# Patient Record
Sex: Female | Born: 1945 | Hispanic: No | Marital: Married | Smoking: Never smoker
Health system: Southern US, Community
[De-identification: ages and names within clinical notes are randomized; demographics above are authoritative.]

## PROBLEM LIST (undated history)

## (undated) DIAGNOSIS — E079 Disorder of thyroid, unspecified: Secondary | ICD-10-CM

## (undated) DIAGNOSIS — E119 Type 2 diabetes mellitus without complications: Secondary | ICD-10-CM

## (undated) DIAGNOSIS — E785 Hyperlipidemia, unspecified: Secondary | ICD-10-CM

---

## 2000-03-05 ENCOUNTER — Encounter: Admission: RE | Admit: 2000-03-05 | Discharge: 2000-06-03 | Payer: Self-pay | Admitting: *Deleted

## 2000-04-19 ENCOUNTER — Encounter: Admission: RE | Admit: 2000-04-19 | Discharge: 2000-04-19 | Payer: Self-pay | Admitting: Internal Medicine

## 2000-04-19 ENCOUNTER — Encounter: Payer: Self-pay | Admitting: Internal Medicine

## 2001-03-20 ENCOUNTER — Other Ambulatory Visit: Admission: RE | Admit: 2001-03-20 | Discharge: 2001-03-20 | Payer: Self-pay | Admitting: Obstetrics & Gynecology

## 2001-03-21 ENCOUNTER — Other Ambulatory Visit: Admission: RE | Admit: 2001-03-21 | Discharge: 2001-03-21 | Payer: Self-pay | Admitting: Obstetrics & Gynecology

## 2001-03-21 ENCOUNTER — Encounter (INDEPENDENT_AMBULATORY_CARE_PROVIDER_SITE_OTHER): Payer: Self-pay

## 2001-04-26 ENCOUNTER — Encounter: Payer: Self-pay | Admitting: Internal Medicine

## 2001-04-26 ENCOUNTER — Encounter: Admission: RE | Admit: 2001-04-26 | Discharge: 2001-04-26 | Payer: Self-pay | Admitting: Internal Medicine

## 2002-06-12 ENCOUNTER — Encounter: Admission: RE | Admit: 2002-06-12 | Discharge: 2002-06-12 | Payer: Self-pay | Admitting: Internal Medicine

## 2002-06-12 ENCOUNTER — Encounter: Payer: Self-pay | Admitting: Internal Medicine

## 2002-09-22 ENCOUNTER — Other Ambulatory Visit: Admission: RE | Admit: 2002-09-22 | Discharge: 2002-09-22 | Payer: Self-pay | Admitting: Obstetrics & Gynecology

## 2003-07-03 ENCOUNTER — Encounter: Admission: RE | Admit: 2003-07-03 | Discharge: 2003-07-03 | Payer: Self-pay | Admitting: Internal Medicine

## 2003-07-03 ENCOUNTER — Encounter: Payer: Self-pay | Admitting: Internal Medicine

## 2003-10-01 ENCOUNTER — Other Ambulatory Visit: Admission: RE | Admit: 2003-10-01 | Discharge: 2003-10-01 | Payer: Self-pay | Admitting: Obstetrics & Gynecology

## 2003-10-09 ENCOUNTER — Encounter: Admission: RE | Admit: 2003-10-09 | Discharge: 2003-10-09 | Payer: Self-pay | Admitting: Obstetrics & Gynecology

## 2004-10-03 ENCOUNTER — Inpatient Hospital Stay (HOSPITAL_COMMUNITY): Admission: EM | Admit: 2004-10-03 | Discharge: 2004-10-05 | Payer: Self-pay | Admitting: Emergency Medicine

## 2004-11-07 ENCOUNTER — Encounter: Admission: RE | Admit: 2004-11-07 | Discharge: 2004-11-07 | Payer: Self-pay | Admitting: Internal Medicine

## 2005-11-08 ENCOUNTER — Encounter: Admission: RE | Admit: 2005-11-08 | Discharge: 2005-11-08 | Payer: Self-pay | Admitting: Internal Medicine

## 2006-01-22 ENCOUNTER — Ambulatory Visit (HOSPITAL_COMMUNITY): Admission: RE | Admit: 2006-01-22 | Discharge: 2006-01-22 | Payer: Self-pay | Admitting: Gastroenterology

## 2006-01-22 ENCOUNTER — Encounter (INDEPENDENT_AMBULATORY_CARE_PROVIDER_SITE_OTHER): Payer: Self-pay | Admitting: Specialist

## 2006-11-29 ENCOUNTER — Encounter: Admission: RE | Admit: 2006-11-29 | Discharge: 2006-11-29 | Payer: Self-pay | Admitting: Internal Medicine

## 2007-12-02 ENCOUNTER — Encounter: Admission: RE | Admit: 2007-12-02 | Discharge: 2007-12-02 | Payer: Self-pay | Admitting: Internal Medicine

## 2008-12-28 ENCOUNTER — Encounter: Admission: RE | Admit: 2008-12-28 | Discharge: 2008-12-28 | Payer: Self-pay | Admitting: Internal Medicine

## 2009-04-21 ENCOUNTER — Ambulatory Visit: Payer: Self-pay | Admitting: Family Medicine

## 2009-04-21 DIAGNOSIS — E039 Hypothyroidism, unspecified: Secondary | ICD-10-CM | POA: Insufficient documentation

## 2009-04-21 DIAGNOSIS — E1065 Type 1 diabetes mellitus with hyperglycemia: Secondary | ICD-10-CM | POA: Insufficient documentation

## 2009-04-21 DIAGNOSIS — E785 Hyperlipidemia, unspecified: Secondary | ICD-10-CM | POA: Insufficient documentation

## 2009-04-21 DIAGNOSIS — IMO0002 Reserved for concepts with insufficient information to code with codable children: Secondary | ICD-10-CM | POA: Insufficient documentation

## 2010-01-05 ENCOUNTER — Encounter: Admission: RE | Admit: 2010-01-05 | Discharge: 2010-01-05 | Payer: Self-pay | Admitting: Internal Medicine

## 2010-12-07 ENCOUNTER — Other Ambulatory Visit: Payer: Self-pay | Admitting: Internal Medicine

## 2010-12-07 DIAGNOSIS — Z1231 Encounter for screening mammogram for malignant neoplasm of breast: Secondary | ICD-10-CM

## 2011-01-16 ENCOUNTER — Ambulatory Visit
Admission: RE | Admit: 2011-01-16 | Discharge: 2011-01-16 | Disposition: A | Discharge status: Home / Self Care | Payer: BC Managed Care – PPO | Source: Ambulatory Visit | Attending: Internal Medicine | Admitting: Internal Medicine

## 2011-01-16 DIAGNOSIS — Z1231 Encounter for screening mammogram for malignant neoplasm of breast: Secondary | ICD-10-CM

## 2011-03-17 NOTE — H&P (Signed)
NAMEJEREMIE, Nichole [name]               ACCOUNT NO.:  [id number]   MEDICAL RECORD NO.:  [id number]          PATIENT TYPE:  INP   LOCATION:  2103                         FACILITY:  [facility]   PHYSICIAN:  Reather Littler, M.D.       DATE OF BIRTH:  Mar 05, 1946   DATE OF ADMISSION:  10/03/2004  DATE OF DISCHARGE:                                HISTORY & PHYSICAL   CHIEF COMPLAINT:  Vomiting.   HISTORY:  This is a patient who has had type 1 diabetes since 1997 and has  been treated with insulin all along.  She was started on an insulin pump in  March of 2002 because of variable blood sugars.  Overall, the patient has  had generally fairly good control and her last A1c was 6.5%.   The day prior to admission, the patient started having blood sugars over 400  and also nausea and vomiting.  She says that the high sugars were probably  from a bubble in the tubing of her infusion set, but despite changing, her  blood sugars did not come down and she continued to have vomiting.  On the  afternoon of the admission, she says her blood sugars started coming down to  180, but she did not have any improvement in her nausea and vomiting.  She  has not been able to take any liquids and keep them down, and continues to  have persistent vomiting and weakness.   The patient states that her insulin infusion site again had bubbles on the  day of admission, but blood sugars did not improve even with changing the  tubing.  She did not check her ketones and did not take any insulin  subcutaneously by injection.  She was then brought to the emergency room  because of persistent vomiting and high sugars.   The patient's basal rate with her [employer]etronic pump is 0.4 from midnight to 4  a.m. and also from 8 a.m. to noon.  From 4 a.m. to 8 a.m., her basal rate is  0.7, from noon to 6 p.m., it is 0.9 and after 6 p.m., it is 1.0.  She  boluses 1 unit per 10 g of carbohydrate.  She rechecks her blood sugar 3  times a day.   MEDICATIONS:  Insulin, Lipitor, Synthroid and aspirin.   ALLERGIES:  None.   PAST HISTORY:  Her only significant illness was Giardia infection.   PERSONAL HISTORY:  She is a nonsmoker.  She is a vegetarian.   FAMILY HISTORY:  No history of diabetes, hypertension or heart disease.   REVIEW OF SYSTEMS:  She has had mild hypothyroidism.  She has had  hypercholesterolemia which is treated.  She is postmenopausal.  She has had  minimal diabetic complications, but no symptoms of neuropathy and also no  retinopathy.  She has previously had no recent GI or genitourinary symptoms.  She has no history of diarrhea or fever/chills.   PHYSICAL EXAMINATION:  GENERAL:  The patient is alert and cooperative.  VITAL SIGNS:  Her blood pressure is 100/60, pulse is 90, temperature normal.  HEENT:  Her mucous membranes look very dry, otherwise, ENT exam normal.  Eyes normal.  NECK:  Exam normal.  HEART:  Heart sounds normal.  LUNGS:  Clear.  ABDOMEN:  No tenderness.  The infusion site is normal.  EXTREMITIES:  Normal.   LABORATORIES:  Glucose is 455, bicarbonate 16.7, potassium 5.3.   ASSESSMENT:  The patient appears to have diabetic ketoacidosis with  persistent vomiting.   PLAN:  Plan would be to start IV insulin with Glucomander and continue her  insulin pump.  We will give her saline to hydrate her and follow blood sugar  hourly.  Chemistry will be rechecked in the morning.       AK/MEDQ  D:  10/04/2004  T:  10/04/2004  Job:  [id number]

## 2011-03-17 NOTE — Op Note (Signed)
Nichole [name], Nichole [name]               ACCOUNT NO.:  [id number]   MEDICAL RECORD NO.:  [id number]          PATIENT TYPE:  AMB   LOCATION:  ENDO                         FACILITY:  [facility]   PHYSICIAN:  Anselmo Rod, M.D.  DATE OF BIRTH:  05-Jan-1946   DATE OF PROCEDURE:  01/22/2006  DATE OF DISCHARGE:                                 OPERATIVE REPORT   PROCEDURE PERFORMED:  Colonoscopy with cold biopsies x 2.   ENDOSCOPIST:  Anselmo Rod, M.D.   INSTRUMENT USED:  Olympus video colonoscope.   INDICATIONS FOR PROCEDURE:  A 66 year old [place] female undergoing screening  colonoscopy to rule out colonic polyps, masses, etc.   PREPROCEDURE PREPARATION:  Informed consent was procured from the patient.  The patient was fasted for eight hours prior to the procedure and prepped  with a bottle of magnesium citrate and a gallon of GoLytely the night prior  to the procedure.  The risks and benefits of the procedure including a 10%  miss rate for cancer or polyps was discussed with the patient as well.   PREPROCEDURE PHYSICAL:  The patient had stable vital signs.  Neck supple.  Chest clear to auscultation.  S1 and S2 regular.  Abdomen soft with normal  bowel sounds.   DESCRIPTION OF PROCEDURE:  The patient was placed in left lateral decubitus  position and sedated with 125 mcg of fentanyl and 12.5 mg of Versed in slow  incremental doses.  Once the patient was adequately sedated and maintained  on low flow oxygen and continuous cardiac monitoring, the Olympus video  colonoscope was advanced from the rectum to the cecum with extreme  difficulty.  The patient had a very tortuous colon.  The patient's position  was changed from left lateral to the supine and right lateral and prone  position and then back to the left lateral position with gentle application  of abdominal pressure on several occasions to reach the cecal base.  A small  sessile polyp was removed by cold biopsies x 2.  The terminal  ileum appeared  healthy without lesions.  The procedure was prolonged as the patient had a  very tortuous colon, multiple washes were done.  The prep was adequate.  Retroflexion in the rectum revealed no abnormalities.   IMPRESSION:  1.  Normal colonoscopy up to the terminal ileum except for small sessile      polyp biopsied from the cecal base.  2.  Very tortuous colon.  Patient's position changed on several occasions.   RECOMMENDATIONS:  1.  Await pathology results.  2.  Avoid all nonsteroidals including aspirin for the next two weeks.  3.  Repeat colonoscopy depending on pathology results.  4.  CBGs will be checked prior to discharge from the endoscopy unit.  5.  Outpatient followup as need arises in the future.      Anselmo Rod, M.D.  Electronically Signed     JNM/MEDQ  D:  01/22/2006  T:  01/23/2006  Job:  [id number]   cc:   Georgann Housekeeper, MD  Fax: [phone]

## 2011-03-17 NOTE — Discharge Summary (Signed)
NAMESINIA, Nichole Nichole [name]               ACCOUNT NO.:  [id number]   MEDICAL RECORD NO.:  [id number]          PATIENT TYPE:  INP   LOCATION:  3711                         FACILITY:  [facility]   PHYSICIAN:  Reather Littler, M.D.       DATE OF BIRTH:  [birth year]   DATE OF ADMISSION:  10/03/2004  DATE OF DISCHARGE:  10/05/2004                                 DISCHARGE SUMMARY   HISTORY:  The patient was admitted with high blood sugars and vomiting and  she was found in the emergency room to be in ketoacidosis.  Her blood sugar  was 455.  The patient had been having high blood sugars for at least Nichole [name] day  and were not improving even with changing her infusion set.  The patient has  had known diabetes since 1997.  The rest of the history as per HPI.   PHYSICAL EXAMINATION:  Blood pressure 100/60, pulse 90, mucous membranes  were very dry.  The rest of her exam was unremarkable.   HOSPITAL COURSE:  The patient was started on intravenous saline and blood  sugar controlled with the help of Glucommander and IV insulin drip.  The  insulin drip was normal by 5:00 Nichole [name].m. and her insulin pump was restarted,  blood pressure had been supported with IV fluids and colloid infusion.  The  patient was observed for another day with increasing diet and blood sugar  was down to 197 on the day of discharge.   DISCHARGE INSTRUCTIONS:  To continue all medications and insulin as before.  To also discuss pump malfunction with the manufacturer of her pump.  The  current basal rate on the pump was 50% higher than prior to her illness and  this will be taken down to the normal basal rate once the patients blood  sugars start decreasing.  Diet as before with no restrictions and follow up  in the office in one week.      AK/MEDQ  D:  02/11/2005  T:  02/11/2005  Job:  [id number]

## 2011-04-10 ENCOUNTER — Other Ambulatory Visit: Payer: Self-pay | Admitting: Obstetrics & Gynecology

## 2011-12-12 ENCOUNTER — Other Ambulatory Visit: Payer: Self-pay | Admitting: Internal Medicine

## 2011-12-12 DIAGNOSIS — Z1231 Encounter for screening mammogram for malignant neoplasm of breast: Secondary | ICD-10-CM

## 2012-02-13 ENCOUNTER — Ambulatory Visit: Payer: BC Managed Care – PPO

## 2012-02-27 ENCOUNTER — Ambulatory Visit
Admission: RE | Admit: 2012-02-27 | Discharge: 2012-02-27 | Disposition: A | Discharge status: Home / Self Care | Payer: Medicare Other | Source: Ambulatory Visit | Attending: Internal Medicine | Admitting: Internal Medicine

## 2012-02-27 DIAGNOSIS — Z1231 Encounter for screening mammogram for malignant neoplasm of breast: Secondary | ICD-10-CM

## 2012-10-18 ENCOUNTER — Emergency Department (INDEPENDENT_AMBULATORY_CARE_PROVIDER_SITE_OTHER)
Admission: EM | Admit: 2012-10-18 | Discharge: 2012-10-18 | Disposition: A | Discharge status: Home / Self Care | Payer: Medicare Other | Source: Home / Self Care | Attending: Family Medicine | Admitting: Family Medicine

## 2012-10-18 DIAGNOSIS — Z008 Encounter for other general examination: Secondary | ICD-10-CM
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
## 2013-01-21 ENCOUNTER — Other Ambulatory Visit: Payer: Self-pay

## 2013-01-21 DIAGNOSIS — Z1231 Encounter for screening mammogram for malignant neoplasm of breast: Secondary | ICD-10-CM

## 2013-02-27 ENCOUNTER — Ambulatory Visit
Admission: RE | Admit: 2013-02-27 | Discharge: 2013-02-27 | Disposition: A | Discharge status: Home / Self Care | Payer: Medicare Other | Source: Ambulatory Visit

## 2013-02-27 DIAGNOSIS — Z1231 Encounter for screening mammogram for malignant neoplasm of breast: Secondary | ICD-10-CM

## 2013-02-28 ENCOUNTER — Other Ambulatory Visit: Payer: Self-pay | Admitting: Internal Medicine

## 2013-02-28 DIAGNOSIS — R928 Other abnormal and inconclusive findings on diagnostic imaging of breast: Secondary | ICD-10-CM

## 2013-04-09 ENCOUNTER — Other Ambulatory Visit: Payer: Self-pay | Admitting: Internal Medicine

## 2013-04-09 ENCOUNTER — Ambulatory Visit
Admission: RE | Admit: 2013-04-09 | Discharge: 2013-04-09 | Disposition: A | Discharge status: Home / Self Care | Payer: Medicare Other | Source: Ambulatory Visit | Attending: Internal Medicine | Admitting: Internal Medicine

## 2013-04-09 DIAGNOSIS — R928 Other abnormal and inconclusive findings on diagnostic imaging of breast: Secondary | ICD-10-CM

## 2013-04-24 ENCOUNTER — Ambulatory Visit
Admission: RE | Admit: 2013-04-24 | Discharge: 2013-04-24 | Disposition: A | Discharge status: Home / Self Care | Payer: Medicare Other | Source: Ambulatory Visit | Attending: Internal Medicine | Admitting: Internal Medicine

## 2013-04-24 DIAGNOSIS — R928 Other abnormal and inconclusive findings on diagnostic imaging of breast: Secondary | ICD-10-CM

## 2013-05-30 ENCOUNTER — Telehealth: Payer: Self-pay | Admitting: Endocrinology

## 2013-05-30 NOTE — Telephone Encounter (Signed)
Pt last seen

## 2013-06-04 ENCOUNTER — Other Ambulatory Visit: Payer: Self-pay | Admitting: *Deleted

## 2013-06-04 MED ORDER — GLUCAGON (RDNA) 1 MG IJ KIT: 1.0000 mg | PACK | Freq: Once | INTRAMUSCULAR | Status: AC | PRN

## 2013-07-07 ENCOUNTER — Other Ambulatory Visit: Payer: Medicare Other

## 2013-07-07 ENCOUNTER — Other Ambulatory Visit: Payer: Self-pay | Admitting: *Deleted

## 2013-07-07 DIAGNOSIS — E039 Hypothyroidism, unspecified: Secondary | ICD-10-CM

## 2013-07-07 DIAGNOSIS — E109 Type 1 diabetes mellitus without complications: Secondary | ICD-10-CM

## 2013-07-07 DIAGNOSIS — E785 Hyperlipidemia, unspecified: Secondary | ICD-10-CM

## 2013-07-08 ENCOUNTER — Other Ambulatory Visit (INDEPENDENT_AMBULATORY_CARE_PROVIDER_SITE_OTHER): Payer: Medicare Other

## 2013-07-08 ENCOUNTER — Ambulatory Visit: Payer: Medicare Other | Admitting: Endocrinology

## 2013-07-08 DIAGNOSIS — E109 Type 1 diabetes mellitus without complications: Secondary | ICD-10-CM

## 2013-07-08 DIAGNOSIS — E785 Hyperlipidemia, unspecified: Secondary | ICD-10-CM

## 2013-07-08 DIAGNOSIS — E039 Hypothyroidism, unspecified: Secondary | ICD-10-CM

## 2013-07-08 LAB — URINALYSIS, ROUTINE W REFLEX MICROSCOPIC
Ketones, ur: NEGATIVE
Nitrite: NEGATIVE
Specific Gravity, Urine: 1.01 (ref 1.000–1.030)
pH: 7 (ref 5.0–8.0)

## 2013-07-08 LAB — LIPID PANEL
Cholesterol: 157 mg/dL (ref 0–200)
LDL Cholesterol: 86 mg/dL (ref 0–99)

## 2013-07-08 LAB — MICROALBUMIN / CREATININE URINE RATIO
Creatinine,U: 80.2 mg/dL
Microalb, Ur: 0.2 mg/dL (ref 0.0–1.9)

## 2013-07-08 LAB — COMPREHENSIVE METABOLIC PANEL
ALT: 16 U/L (ref 0–35)
CO2: 31 mEq/L (ref 19–32)
Creatinine, Ser: 0.7 mg/dL (ref 0.4–1.2)
GFR: 93.34 mL/min (ref >60.00)
Total Bilirubin: 1 mg/dL (ref 0.3–1.2)

## 2013-07-08 LAB — T4, FREE: Free T4: 1.44 ng/dL (ref 0.60–1.60)

## 2013-07-10 ENCOUNTER — Encounter: Payer: Self-pay | Admitting: Endocrinology

## 2013-07-14 ENCOUNTER — Encounter: Payer: Self-pay | Admitting: Endocrinology

## 2013-07-14 ENCOUNTER — Ambulatory Visit (INDEPENDENT_AMBULATORY_CARE_PROVIDER_SITE_OTHER): Payer: Medicare Other | Admitting: Endocrinology

## 2013-07-14 VITALS — BP 128/80 | HR 76 | Temp 98.3°F | Resp 12 | Ht 67.0 in | Wt 164.1 lb

## 2013-07-14 DIAGNOSIS — E039 Hypothyroidism, unspecified: Secondary | ICD-10-CM

## 2013-07-14 DIAGNOSIS — E109 Type 1 diabetes mellitus without complications: Secondary | ICD-10-CM

## 2013-07-14 DIAGNOSIS — E785 Hyperlipidemia, unspecified: Secondary | ICD-10-CM

## 2013-07-14 NOTE — Patient Instructions (Addendum)
Bolus 15-30 min before meals  Add 1 unit to lunch for bolus  If sugar < 120 may use temp basal 50%

## 2013-07-14 NOTE — Progress Notes (Signed)
Patient ID: Nichole [name], female   DOB: 10/19/1946, 67 y.o.   MRN: [id number]  Nichole [name] is an 67 y.o. female.   Reason for Appointment: Insulin Pump followup:   History of Present Illness   Diagnosis: Type 1 DIABETES MELITUS, date of diagnosis: 1997      DIABETES history: She was started on insulin pump in 2002 because of overall poor control and variability in blood sugars  CURRENT insulin pump:  [employer]  HISTORY: Her blood sugars appear to be overall poorly controlled with average blood sugar over 200 Has difficulty controlling postprandial hyperglycemia and has had periodic adjustments to her settings made Currently taking relatively high coverage for carbohydrates Most of the hyperglycemia appears to be right after lunch and persisting into the evening Variable readings after breakfast, occasionally much higher. Random episodes of low blood sugars either late morning and last night at 1 AM Very high carbohydrate coverage required along with her entering only 10-20 g; she thinks she is doing this accurately Mostly high readings right after waking up average blood sugar before breakfast 184 Also she is concerned about her weight gain  The pump SETTINGS are: Basal rate: See attached document  Carb Ratio: 1: 7/4/4 at meals Correction factor 1: 40 with target 140 except from 7-known = 100  GLUCOSE CONTROL with the pump is assessed today by pump download.   Compared to the last visit the diabetes is relatively worse since last A1c was 7.6 in April  Lab Results  Component Value Date   HGBA1C 8.6* 07/08/2013    EXERCISE: Walking 4-5/7 days, frequently in the mornings. Blood sugar does not go down  Until the end of her walk  MICROALBUMIN has been tested, and the result is normal .   CGM RECORD INTERPRETATION    Dates of Recording: 06/30/13 through 07/13/13        Indications: Poor glycemic control. Variable blood sugars and unpredictable hyperglycemia and hypoglycemia.  Identification of postprandial patterns and patterns of overnight glycemia     Sensor  summary:  Quality of the data is excellent with adequate sensor function. Glucose excursion profile shows      Glycemic patterns:   she has significant postprandial hyperglycemia after lunch but also continuing hyperglycemia into the late evening; starting high excursions are about 11:30 AM and again about 6 PM.. Significant amount of variability at all times with standard deviation 70. Overall average 210      Overnight periods:   blood sugar at bedtime as averaging about 160 with tendency to gradual decline and leveling off at about 140s between 4 AM-6 AM. No hypoglycemia on any day with lowest blood sugar about 70      Preprandial periods:   Average glucose before breakfast is 184, before lunch 147 and before supper 229      Postprandial periods:   blood sugar is increasing to about 206 after breakfast but has had marked increase on 2 of the days. Blood sugars are increasing more than 100 mg after lunch with somewhat variable response. After supper blood sugars relatively unchanged an average but showed mild increase about 2 hours later. Significant variability present after supper      Hypoglycemia:  not detected except transiently at 5 PM once      LABS:  Appointment on 07/08/2013  Component Date Value Range Status  . Cholesterol 07/08/2013 157  0 - 200 mg/dL Final   ATP III Classification       Desirable:  <  200 mg/dL               Borderline High:  200 - 239 mg/dL          High:  > = 161 mg/dL  . Triglycerides 07/08/2013 65.0  0.0 - 149.0 mg/dL Final   Normal:  <096 mg/dLBorderline High:  150 - 199 mg/dL  . HDL 07/08/2013 58.00  >39.00 mg/dL Final  . VLDL 04/54/0981 13.0  0.0 - 40.0 mg/dL Final  . LDL Cholesterol 07/08/2013 86  0 - 99 mg/dL Final  . Total CHOL/HDL Ratio 07/08/2013 3   Final                  Men          Women1/2 Average Risk     3.4          3.3Average Risk          5.0           4.42X Average Risk          9.6          7.13X Average Risk          15.0          11.0                      . Hemoglobin A1C 07/08/2013 8.6* 4.6 - 6.5 % Final   Glycemic Control Guidelines for People with Diabetes:Non Diabetic:  <6%Goal of Therapy: <7%Additional Action Suggested:  >8%   . Microalb, Ur 07/08/2013 0.2  0.0 - 1.9 mg/dL Final  . Creatinine,U 19/14/7829 80.2   Final  . Microalb Creat Ratio 07/08/2013 0.2  0.0 - 30.0 mg/g Final  . TSH 07/08/2013 1.90  0.35 - 5.50 uIU/mL Final  . Free T4 07/08/2013 1.44  0.60 - 1.60 ng/dL Final  . Sodium 56/21/3086 142  135 - 145 mEq/L Final  . Potassium 07/08/2013 3.5  3.5 - 5.1 mEq/L Final  . Chloride 07/08/2013 104  96 - 112 mEq/L Final  . CO2 07/08/2013 31  19 - 32 mEq/L Final  . Glucose, Bld 07/08/2013 61* 70 - 99 mg/dL Final  . BUN 57/84/6962 12  6 - 23 mg/dL Final  . Creatinine, Ser 07/08/2013 0.7  0.4 - 1.2 mg/dL Final  . Total Bilirubin 07/08/2013 1.0  0.3 - 1.2 mg/dL Final  . Alkaline Phosphatase 07/08/2013 79  39 - 117 U/L Final  . AST 07/08/2013 24  0 - 37 U/L Final  . ALT 07/08/2013 16  0 - 35 U/L Final  . Total Protein 07/08/2013 6.8  6.0 - 8.3 g/dL Final  . Albumin 95/28/4132 3.5  3.5 - 5.2 g/dL Final  . Calcium 44/10/270 9.5  8.4 - 10.5 mg/dL Final  . GFR 53/66/4403 93.34  >60.00 mL/min Final  . Color, Urine 07/08/2013 LT. YELLOW  Yellow;Lt. Yellow Final  . APPearance 07/08/2013 CLEAR  Clear Final  . Specific Gravity, Urine 07/08/2013 1.010  1.000-1.030 Final  . pH 07/08/2013 7.0  5.0 - 8.0 Final  . Total Protein, Urine 07/08/2013 NEGATIVE  Negative Final  . Urine Glucose 07/08/2013 250  Negative Final  . Ketones, ur 07/08/2013 NEGATIVE  Negative Final  . Bilirubin Urine 07/08/2013 NEGATIVE  Negative Final  . Hgb urine dipstick 07/08/2013 LARGE  Negative Final  . Urobilinogen, UA 07/08/2013 0.2  0.0 - 1.0 Final  . Leukocytes, UA 07/08/2013 SMALL  Negative Final  . Nitrite 07/08/2013 NEGATIVE  Negative Final  . WBC, UA  07/08/2013 11-20/hpf  0-2/hpf Final  . RBC / HPF 07/08/2013 7-10/hpf  0-2/hpf Final  . Bacteria, UA 07/08/2013 Rare(<10/hpf)  None Final      Medication List       This list is accurate as of: 07/14/13  8:25 AM.  Always use your most recent med list.               aspirin 81 MG tablet  Take 81 mg by mouth daily.     atorvastatin 20 MG tablet  Commonly known as:  LIPITOR  Take 20 mg by mouth daily.     BAYER CONTOUR NEXT TEST test strip  Generic drug:  glucose blood     BAYER MICROLET LANCETS lancets     EYE DROPS A/C OP  Apply to eye. One drop in each eye daily     glucagon 1 MG injection  Inject 1 mg into the vein once as needed.     levothyroxine 75 MCG tablet  Commonly known as:  SYNTHROID, LEVOTHROID  Take 75 mcg by mouth daily before breakfast.     timolol 0.5 % ophthalmic solution  Commonly known as:  TIMOPTIC     VITAMIN D PO  Take 500 Units by mouth daily.        Allergies: No Known Allergies  No past medical history on file.  No past surgical history on file.  No family history on file.  Social History:  reports that she has never smoked. She has never used smokeless tobacco. She reports that she does not drink alcohol or use illicit drugs.  REVIEW of systems:  She has had long-standing hypothyroidism, adequately replaced at 75 mcg and her TSH was normal in 6/14 No history of neuropathy She has been on Lipitor for hypercholesterolemia with adequate control   EXAM:  BP 128/80  Pulse 76  Temp(Src) 98.3 F (36.8 C)  Resp 12  Ht 5\' 7"  (1.702 m)  Wt 164 lb 1.6 oz (74.435 kg)  BMI 25.7 kg/m2  SpO2 98%  ASSESSMENT:  Problems identified: Overall excessively high readings with average blood sugar at home 210 Most of the hypoglycemia appears to be right after lunch and persisting into the evening Variable readings after breakfast, occasionally much higher. Random episodes of low blood sugars either late morning and last night at 1 AM Very  high carbohydrate coverage required along with her entering only 10-20 g; she thinks she is doing this accurately Mostly high readings right after waking up average blood sugar before breakfast 184 Also she is concerned about her weight gain  Plan:  Increase basal rates between noon - 2 PM = 0.6, 2 PM = 0.7 and 5 PM = 0. 45 until 7 PM Continue the bolus 30 minutes before a meal unless blood sugar is low normal Consider using the temporary basal when going for long walks especially towards the end Make sure mealtime carbohydrate is calculated accurately Additional one unit to the meal bolus calculation at lunchtime  Consider Victoza on the next visit for GLP-1 benefit as she tends to have tendency to postprandial hyperglycemia  Counseling time over 50% of today's 25 minute visit  Walker Sitar 07/14/2013, 8:25 AM

## 2013-09-08 ENCOUNTER — Other Ambulatory Visit: Payer: Medicare Other

## 2013-09-15 ENCOUNTER — Ambulatory Visit: Payer: Medicare Other | Admitting: Endocrinology

## 2013-10-03 ENCOUNTER — Other Ambulatory Visit (INDEPENDENT_AMBULATORY_CARE_PROVIDER_SITE_OTHER): Payer: Medicare Other

## 2013-10-03 ENCOUNTER — Ambulatory Visit: Payer: Medicare Other | Admitting: Endocrinology

## 2013-10-03 DIAGNOSIS — E109 Type 1 diabetes mellitus without complications: Secondary | ICD-10-CM

## 2013-10-03 DIAGNOSIS — E785 Hyperlipidemia, unspecified: Secondary | ICD-10-CM

## 2013-10-03 DIAGNOSIS — E039 Hypothyroidism, unspecified: Secondary | ICD-10-CM

## 2013-10-03 LAB — BASIC METABOLIC PANEL
BUN: 15 mg/dL (ref 6–23)
Calcium: 9.4 mg/dL (ref 8.4–10.5)
Creatinine, Ser: 0.7 mg/dL (ref 0.4–1.2)
GFR: 83.17 mL/min (ref >60.00)

## 2013-10-03 LAB — LIPID PANEL
HDL: 62.3 mg/dL (ref >39.00)
LDL Cholesterol: 78 mg/dL (ref 0–99)

## 2013-10-03 LAB — T4, FREE: Free T4: 1.27 ng/dL (ref 0.60–1.60)

## 2013-10-03 LAB — TSH: TSH: 2.32 u[IU]/mL (ref 0.35–5.50)

## 2013-10-10 ENCOUNTER — Ambulatory Visit (INDEPENDENT_AMBULATORY_CARE_PROVIDER_SITE_OTHER): Payer: Medicare Other | Admitting: Endocrinology

## 2013-10-10 ENCOUNTER — Encounter: Payer: Self-pay | Admitting: Endocrinology

## 2013-10-10 VITALS — BP 130/82 | HR 72 | Temp 98.1°F | Resp 12 | Ht 67.0 in | Wt 160.5 lb

## 2013-10-10 DIAGNOSIS — E1065 Type 1 diabetes mellitus with hyperglycemia: Secondary | ICD-10-CM

## 2013-10-10 DIAGNOSIS — E039 Hypothyroidism, unspecified: Secondary | ICD-10-CM

## 2013-10-10 DIAGNOSIS — E785 Hyperlipidemia, unspecified: Secondary | ICD-10-CM

## 2013-10-10 NOTE — Patient Instructions (Signed)
Bolus 15 min before meals

## 2013-10-10 NOTE — Progress Notes (Addendum)
Patient ID: Nichole [name], female   DOB: Nov 13, 1945, 67 y.o.   MRN: [id number]   Reason for Appointment: Insulin Pump followup:   History of Present Illness   Diagnosis: Type 1 DIABETES MELITUS, date of diagnosis: 1997      DIABETES history: She was started on insulin pump in 2002 because of overall poor control and variability in blood sugars  CURRENT insulin pump:  [employer]  HISTORY: Her blood sugars have been significantly high for several months now despite using her pump and continuous glucose monitoring Has difficulty controlling postprandial hyperglycemia and has had periodic adjustments to her settings made She was given a trial of Symlin 30 mcg previously and this did not appear to help She is trying to do a bolus 30 minutes before eating and also trying to have protein at every meal Her BASAL rate is overall relatively low but needs a markedly higher basal rate around meal times especially between 7 AM.-10 AM Recent FASTING glucose readings range from 173-322 BOLUSES: She is usually taking relatively high coverage for carbohydrates but that her new pump basal settings were programmed incorrectly to 1:15 Most of the postprandial hyperglycemia appears to be  after lunch and late afternoon with an average peak reading nearly 300 Blood sugars AFTER evening meals and also averaging in the high 200 range Variable readings after breakfast, occasionally much higher. HYPOGLYCEMIA: She has Random episodes of low blood sugars but recently only once before supper However she is having a pressure or suspend fairly frequently when blood sugar is decreasing rapidly below her target of 85, especially mid morning and also after her evening meal last night; no hypoglycemia overnight  The pump SETTINGS are: Basal rates are numerous and scanned in Carb Ratio: 1: 15 at meals Correction factor 1: 40 with target 140 except from 7am -noon= 100  EXERCISE: Walking 3-4/7 days, frequently in the mornings.  Blood sugar does not go down  Until the end of her walk   CGM RECORD INTERPRETATION    Dates of Recording: 09/26/13 through 10/09/13          Sensor  summary:  Quality of the data is excellent with good sensor function. She is wearing the sensor throughout the week. Glucose excursion profile shows 58 hyperglycemic events and 1 hypoglycemic event      Glycemic patterns:   She has significant postprandial hyperglycemia 2-4 hours after lunch. Also has high readings between  7 PM-1 AM. This starts about 2 hours after supper. Blood sugars are trending higher between 6 AM-8 AM Overall has significant variability in her blood sugars from day-to-day. Overall average 217, previously 210 with standard deviation 71      Overnight periods:   Her glucose is averaging about 240 at midnight and gradually  decreasing until 3 AM and then again gradually increasing after 5 AM without any low excursions      Preprandial periods:   Average glucose before breakfast is 205, before lunch 182 and before supper 190, improved before supper from her last visit      Postprandial periods:   blood sugar is slightly better on an average after breakfast, increasing by about 50 mg after lunch and about 36 mg after supper . Significant variability present after breakfast and supper      Hypoglycemia:  not detected except transiently at 5:30 PM once      LABS:  No visits with results within 1 Week(s) from this visit. Latest known visit with results is:  Appointment on 10/03/2013  Component Date Value Range Status  . Fructosamine 10/03/2013 383* <285 umol/L Final   Comment:                            Variations in levels of serum proteins (albumin and immunoglobulins)                          may affect fructosamine results.                             . Sodium 10/03/2013 141  135 - 145 mEq/L Final  . Potassium 10/03/2013 3.7  3.5 - 5.1 mEq/L Final  . Chloride 10/03/2013 107  96 - 112 mEq/L Final  . CO2  10/03/2013 28  19 - 32 mEq/L Final  . Glucose, Bld 10/03/2013 156* 70 - 99 mg/dL Final  . BUN 91/47/8295 15  6 - 23 mg/dL Final  . Creatinine, Ser 10/03/2013 0.7  0.4 - 1.2 mg/dL Final  . Calcium 62/13/0865 9.4  8.4 - 10.5 mg/dL Final  . GFR 78/46/9629 83.17  >60.00 mL/min Final  . Free T4 10/03/2013 1.27  0.60 - 1.60 ng/dL Final  . TSH 52/84/1324 2.32  0.35 - 5.50 uIU/mL Final  . Cholesterol 10/03/2013 153  0 - 200 mg/dL Final   ATP III Classification       Desirable:  < 200 mg/dL               Borderline High:  200 - 239 mg/dL          High:  > = 401 mg/dL  . Triglycerides 10/03/2013 62.0  0.0 - 149.0 mg/dL Final   Normal:  <027 mg/dLBorderline High:  150 - 199 mg/dL  . HDL 10/03/2013 62.30  >39.00 mg/dL Final  . VLDL 25/36/6440 12.4  0.0 - 40.0 mg/dL Final  . LDL Cholesterol 10/03/2013 78  0 - 99 mg/dL Final  . Total CHOL/HDL Ratio 10/03/2013 2   Final                  Men          Women1/2 Average Risk     3.4          3.3Average Risk          5.0          4.42X Average Risk          9.6          7.13X Average Risk          15.0          11.0                          Medication List       This list is accurate as of: 10/10/13  9:59 AM.  Always use your most recent med list.               aspirin 81 MG tablet  Take 81 mg by mouth daily.     atorvastatin 20 MG tablet  Commonly known as:  LIPITOR  Take 20 mg by mouth daily.     BAYER CONTOUR NEXT TEST test strip  Generic drug:  glucose blood     BAYER MICROLET LANCETS lancets     EYE DROPS A/C  OP  Apply to eye. One drop in each eye daily     FLUVIRIN Inj injection  Generic drug:  influenza (>/= 3 years) inactive virus vaccine     glucagon 1 MG injection  Inject 1 mg into the vein once as needed.     insulin aspart 100 UNIT/ML Soct cartridge  Commonly known as:  novoLOG  Inject into the skin 3 (three) times daily with meals. Max 35 units     levothyroxine 75 MCG tablet  Commonly known as:  SYNTHROID, LEVOTHROID   Take 75 mcg by mouth daily before breakfast.     timolol 0.5 % ophthalmic solution  Commonly known as:  TIMOPTIC     VITAMIN D PO  Take 500 Units by mouth daily.        Allergies: No Known Allergies  No past medical history on file.  No past surgical history on file.  No family history on file.  Social History:  reports that she has never smoked. She has never used smokeless tobacco. She reports that she does not drink alcohol or use illicit drugs.  REVIEW of systems:  She has had long-standing hypothyroidism, adequately replaced at 75 mcg   Lab Results  Component Value Date   TSH 2.32 10/03/2013    No history of neuropathy She has been on Lipitor for hypercholesterolemia with adequate control   EXAM:  BP 130/82  Pulse 72  Temp(Src) 98.1 F (36.7 C)  Resp 12  Ht 5\' 7"  (1.702 m)  Wt 160 lb 8 oz (72.802 kg)  BMI 25.13 kg/m2  SpO2 99%  ASSESSMENT:  See history of present illness for detailed assessment of her blood sugar patterns, current management and problems identified are as follows:   Overall blood sugars are high especially after meals.  She is needing significantly higher basal rates around mealtimes and blood sugars are not improving despite progressively increasing these rates  Her postprandial hyperglycemia is partly related to the incorrect programming of her carbohydrate ratio on her new pump.  She is fairly good about checking her blood sugars and bolusing before her meals but her A1c continues to be relatively high   Plan:  New basal rates: 6 AM = 1.6, 1 PM = 0.6, 2 PM = 0.85 and 7 PM = 1.2 Carbohydrate coverage 1:12 4 breakfast and 1:6 before lunch and dinner Use bolus about 15 minutes before every meal unless blood sugar is low normal   Consider Victoza on the next visit for GLP-1 benefit as she tends to have tendency to postprandial hyperglycemia. Discussed how this works. She wants to defer this until next visit  Counseling time  over 50% of today's 25 minute visit  Torence Palmeri 10/10/2013, 9:59 AM

## 2013-10-30 HISTORY — PX: BREAST BIOPSY: SHX20

## 2013-11-21 ENCOUNTER — Ambulatory Visit: Payer: Medicare Other | Admitting: Endocrinology

## 2014-03-31 ENCOUNTER — Other Ambulatory Visit: Payer: Self-pay

## 2014-03-31 DIAGNOSIS — Z1231 Encounter for screening mammogram for malignant neoplasm of breast: Secondary | ICD-10-CM

## 2014-04-08 ENCOUNTER — Ambulatory Visit: Payer: Medicare Other

## 2014-04-09 ENCOUNTER — Ambulatory Visit
Admission: RE | Admit: 2014-04-09 | Discharge: 2014-04-09 | Disposition: A | Discharge status: Home / Self Care | Payer: Medicare HMO | Source: Ambulatory Visit

## 2014-04-09 DIAGNOSIS — Z1231 Encounter for screening mammogram for malignant neoplasm of breast: Secondary | ICD-10-CM

## 2014-06-21 NOTE — Progress Notes (Signed)
This encounter was created in error - please disregard.

## 2014-07-30 ENCOUNTER — Emergency Department (INDEPENDENT_AMBULATORY_CARE_PROVIDER_SITE_OTHER): Payer: Medicare HMO

## 2014-07-30 ENCOUNTER — Emergency Department
Admission: EM | Admit: 2014-07-30 | Discharge: 2014-07-30 | Disposition: A | Discharge status: Home / Self Care | Payer: Medicare HMO | Source: Home / Self Care | Attending: Emergency Medicine | Admitting: Emergency Medicine

## 2014-07-30 ENCOUNTER — Encounter: Payer: Self-pay | Admitting: Emergency Medicine

## 2014-07-30 DIAGNOSIS — M25572 Pain in left ankle and joints of left foot: Secondary | ICD-10-CM

## 2014-07-30 DIAGNOSIS — S93105D Unspecified dislocation of left toe(s), subsequent encounter: Secondary | ICD-10-CM

## 2014-07-30 DIAGNOSIS — M533 Sacrococcygeal disorders, not elsewhere classified: Secondary | ICD-10-CM

## 2014-07-30 DIAGNOSIS — M545 Low back pain, unspecified: Secondary | ICD-10-CM

## 2014-07-30 DIAGNOSIS — W109XXD Fall (on) (from) unspecified stairs and steps, subsequent encounter: Secondary | ICD-10-CM

## 2014-07-30 DIAGNOSIS — M79672 Pain in left foot: Secondary | ICD-10-CM

## 2014-07-30 DIAGNOSIS — S93105A Unspecified dislocation of left toe(s), initial encounter: Secondary | ICD-10-CM

## 2014-07-30 DIAGNOSIS — R079 Chest pain, unspecified: Secondary | ICD-10-CM

## 2014-07-30 MED ORDER — TRAMADOL HCL 50 MG PO TABS: 50.0000 mg | ORAL_TABLET | Freq: Four times a day (QID) | ORAL | Status: AC | PRN

## 2014-07-30 NOTE — ED Notes (Signed)
Fell down flight of stairs today, Left ankle and foot, right hip and buttock, mid back pain 7/10

## 2014-07-30 NOTE — ED Provider Notes (Signed)
CSN: [id number]     Arrival date & time 07/30/14  1642 History   First MD Initiated Contact with Patient 07/30/14 1653     Chief Complaint  Patient presents with  . Fall   (Consider location/radiation/quality/duration/timing/severity/associated sxs/prior Treatment) HPI This patient tripped and fell down a flight of stairs today.  She states that she rolled down the stairs at home.  Her previous injuries to those particular areas prior to today.  Main concern is her right great toe.  Hurts to bear weight.  No swelling.  Also with some pain at the same ankle on the outer aspect.  Had a little bit of blood there as well but stopped with pressure.  Also some pain in her right buttock area as well as in her left ribs.  No shortness of breath, chest pain.  No head injury or loss of consciousness.  No blurry vision, confusion  History reviewed. No pertinent past medical history. History reviewed. No pertinent past surgical history. No family history on file. History  Substance Use Topics  . Smoking status: Never Smoker   . Smokeless tobacco: Never Used  . Alcohol Use: No   OB History   Grav Para Term Preterm Abortions TAB SAB Ect Mult Living                 Review of Systems  All other systems reviewed and are negative.   Allergies  Review of patient's allergies indicates not on file.  Home Medications   Prior to Admission medications   Medication Sig Start Date End Date Taking? Authorizing Provider  aspirin 81 MG tablet Take 81 mg by mouth daily.    Historical Provider, MD  atorvastatin (LIPITOR) 20 MG tablet Take 20 mg by mouth daily.    Historical Provider, MD  BAYER CONTOUR NEXT TEST test strip  07/08/13   Historical Provider, MD  BAYER MICROLET LANCETS lancets  04/15/13   Historical Provider, MD  Cholecalciferol (VITAMIN D PO) Take 500 Units by mouth daily.    Historical Provider, MD  FLUVIRIN INJ injection  08/19/13   Historical Provider, MD  glucagon 1 MG injection Inject 1 mg  into the vein once as needed. 06/04/13   Elayne Snare, MD  insulin aspart (NOVOLOG) 100 UNIT/ML SOCT cartridge Inject into the skin 3 (three) times daily with meals. Max 35 units    Historical Provider, MD  levothyroxine (SYNTHROID, LEVOTHROID) 75 MCG tablet Take 75 mcg by mouth daily before breakfast.    Historical Provider, MD  Tetrahydrozoline-Zn Sulfate (EYE DROPS A/C OP) Apply to eye. One drop in each eye daily    Historical Provider, MD  timolol (TIMOPTIC) 0.5 % ophthalmic solution  07/08/13   Historical Provider, MD  traMADol (ULTRAM) 50 MG tablet Take 1 tablet (50 mg total) by mouth every 6 (six) hours as needed. 07/30/14   Janeann Forehand, MD   BP 130/77  Pulse 72  Temp(Src) 97.4 F (36.3 C) (Oral)  Ht 5\' 6"  (1.676 m)  Wt 165 lb (74.844 kg)  BMI 26.64 kg/m2  SpO2 99% Physical Exam  Nursing note and vitals reviewed. Constitutional: She is oriented to person, place, and time. She appears well-developed and well-nourished. She appears distressed (distress with bearing weight).  HENT:  Head: Normocephalic and atraumatic. Head is without contusion and without laceration.  Eyes: No scleral icterus.  Neck: Neck supple.  Cardiovascular: Regular rhythm and normal heart sounds.   Pulmonary/Chest: Effort normal and breath sounds normal. No respiratory distress.  She has no decreased breath sounds. She has no wheezes. She has no rhonchi.  Musculoskeletal:  Status post reduction of the IP joint dislocation, though with normal function and no tenderness to palpation  No anterior hip tenderness.  [place]roll normal.  Resisted strength normal.  Neurological: She is alert and oriented to person, place, and time.  Skin: Skin is warm and dry.     Psychiatric: She has a normal mood and affect. Her speech is normal.    ED Course  Procedures (including critical care time) Labs Review Labs Reviewed - No data to display  Imaging Review Dg Ribs Unilateral W/chest Left  07/30/2014   CLINICAL DATA:   Left chest pain after fall down stairs.  EXAM: LEFT RIBS AND CHEST - 3+ VIEW  COMPARISON:  July 02, 2012.  FINDINGS: No fracture or other bone lesions are seen involving the ribs. There is no evidence of pneumothorax or pleural effusion. Both lungs are clear. No pneumothorax or pleural effusion is noted. Heart size and mediastinal contours are within normal limits.  IMPRESSION: Normal left ribs.  No acute cardiopulmonary abnormality seen.   Electronically Signed   By: Sabino Dick M.D.   On: 07/30/2014 17:50   Dg Sacrum/coccyx  07/30/2014   CLINICAL DATA:  68 year old female with pain after fall  EXAM: SACRUM AND COCCYX - 2+ VIEW  COMPARISON:  None.  FINDINGS: Visualized aspects of the bony pelvic ring are intact. Unremarkable appearance of the visualized lower lumbar spine. New go uncertain electronic radiopaque device overlying the right sacroiliac joint.  No displaced sacral fracture is identified.  IMPRESSION: No acute bony abnormality of the sacrum or visualized pelvis.  Signed,  Dulcy Fanny. Earleen Newport, DO  [facility]  [facility]   Electronically Signed   By: Corrie Mckusick O.D.   On: 07/30/2014 18:35   Dg Ankle Complete Left  07/30/2014   CLINICAL DATA:  Acute left ankle pain after fall downstairs.  EXAM: LEFT FOOT - COMPLETE 3+ VIEW; LEFT ANKLE COMPLETE - 3+ VIEW  COMPARISON:  None.  FINDINGS: Possible minimally displaced avulsion fracture is seen involving the inferior portion the medial malleolus. Joint spaces are intact. No significant soft tissue swelling is noted.  IMPRESSION: Possible minimally displaced traumatic closed avulsion fracture involving inferior portion of medial malleolus.   Electronically Signed   By: Sabino Dick M.D.   On: 07/30/2014 17:54   Dg Foot Complete Left  07/30/2014   CLINICAL DATA:  Acute left ankle pain after fall downstairs.  EXAM: LEFT FOOT - COMPLETE 3+ VIEW; LEFT ANKLE COMPLETE - 3+ VIEW  COMPARISON:  None.  FINDINGS:  Possible minimally displaced avulsion fracture is seen involving the inferior portion the medial malleolus. Joint spaces are intact. No significant soft tissue swelling is noted.  IMPRESSION: Possible minimally displaced traumatic closed avulsion fracture involving inferior portion of medial malleolus.   Electronically Signed   By: Sabino Dick M.D.   On: 07/30/2014 17:54   My read of the foot Xray is + IP dislocation, then reduced on the 2nd film.  Possible small avusions vs OA changes.  MDM   1. Midline low back pain without sciatica   2. Left foot pain   3. Left ankle pain    X-rays are taken and reported radiologist as above.  Patient with no medial sided ankle pain so avulsion fracture likely not possible at this time.  However she did have a dislocation of the IP joint of the  foot.  That was relocated with a digital block as below.  Then a repeat x-ray was obtained.  She will be placed in a walking boot for the next 2-3 weeks to take care of the pain in the toe and ankle.  I have advised her to follow up with private care physician in about 2 weeks as well to followup on these problems.  If back and hip not significantly improving would get dedicated x-rays at the time of the pelvis and bilateral hips as well as lumbar spine.  With minimal pain level today, will defer.  For consent obtained.  Risks benefits alternatives discussed.  Area was prepped with iodine.  A digital block was performed in the left great toe.  Using traction, the IP joint was relocated.  Patient tolerated procedure well.  Janeann Forehand, MD 07/30/14 [phone]

## 2015-03-16 ENCOUNTER — Other Ambulatory Visit: Payer: Self-pay

## 2015-03-16 DIAGNOSIS — Z1231 Encounter for screening mammogram for malignant neoplasm of breast: Secondary | ICD-10-CM

## 2015-04-23 ENCOUNTER — Ambulatory Visit
Admission: RE | Admit: 2015-04-23 | Discharge: 2015-04-23 | Disposition: A | Discharge status: Home / Self Care | Payer: Medicare HMO | Source: Ambulatory Visit

## 2015-04-23 DIAGNOSIS — Z1231 Encounter for screening mammogram for malignant neoplasm of breast: Secondary | ICD-10-CM

## 2015-08-02 DIAGNOSIS — E039 Hypothyroidism, unspecified: Secondary | ICD-10-CM | POA: Diagnosis not present

## 2015-08-02 DIAGNOSIS — E785 Hyperlipidemia, unspecified: Secondary | ICD-10-CM | POA: Diagnosis not present

## 2015-08-02 DIAGNOSIS — E1065 Type 1 diabetes mellitus with hyperglycemia: Secondary | ICD-10-CM | POA: Diagnosis not present

## 2015-08-25 DIAGNOSIS — E109 Type 1 diabetes mellitus without complications: Secondary | ICD-10-CM | POA: Diagnosis not present

## 2015-08-31 DIAGNOSIS — E109 Type 1 diabetes mellitus without complications: Secondary | ICD-10-CM | POA: Diagnosis not present

## 2015-09-13 DIAGNOSIS — H40013 Open angle with borderline findings, low risk, bilateral: Secondary | ICD-10-CM | POA: Diagnosis not present

## 2015-09-13 DIAGNOSIS — E119 Type 2 diabetes mellitus without complications: Secondary | ICD-10-CM | POA: Diagnosis not present

## 2015-09-13 DIAGNOSIS — H401133 Primary open-angle glaucoma, bilateral, severe stage: Secondary | ICD-10-CM | POA: Diagnosis not present

## 2015-09-16 DIAGNOSIS — E1065 Type 1 diabetes mellitus with hyperglycemia: Secondary | ICD-10-CM | POA: Diagnosis not present

## 2015-09-22 DIAGNOSIS — R42 Dizziness and giddiness: Secondary | ICD-10-CM | POA: Diagnosis not present

## 2015-09-22 DIAGNOSIS — R51 Headache: Secondary | ICD-10-CM | POA: Diagnosis not present

## 2015-09-22 DIAGNOSIS — I6782 Cerebral ischemia: Secondary | ICD-10-CM | POA: Diagnosis not present

## 2015-09-22 DIAGNOSIS — R112 Nausea with vomiting, unspecified: Secondary | ICD-10-CM | POA: Diagnosis not present

## 2015-11-19 DIAGNOSIS — E10649 Type 1 diabetes mellitus with hypoglycemia without coma: Secondary | ICD-10-CM | POA: Diagnosis not present

## 2015-11-19 DIAGNOSIS — E039 Hypothyroidism, unspecified: Secondary | ICD-10-CM | POA: Diagnosis not present

## 2015-11-19 DIAGNOSIS — E1065 Type 1 diabetes mellitus with hyperglycemia: Secondary | ICD-10-CM | POA: Diagnosis not present

## 2015-11-19 DIAGNOSIS — E785 Hyperlipidemia, unspecified: Secondary | ICD-10-CM | POA: Diagnosis not present

## 2015-11-26 DIAGNOSIS — R05 Cough: Secondary | ICD-10-CM | POA: Diagnosis not present

## 2015-11-26 DIAGNOSIS — J329 Chronic sinusitis, unspecified: Secondary | ICD-10-CM | POA: Diagnosis not present

## 2015-12-28 DIAGNOSIS — R69 Illness, unspecified: Secondary | ICD-10-CM | POA: Diagnosis not present

## 2016-01-03 DIAGNOSIS — E1065 Type 1 diabetes mellitus with hyperglycemia: Secondary | ICD-10-CM | POA: Diagnosis not present

## 2016-01-05 DIAGNOSIS — J0191 Acute recurrent sinusitis, unspecified: Secondary | ICD-10-CM | POA: Diagnosis not present

## 2016-01-05 DIAGNOSIS — E109 Type 1 diabetes mellitus without complications: Secondary | ICD-10-CM | POA: Diagnosis not present

## 2016-01-11 DIAGNOSIS — H40013 Open angle with borderline findings, low risk, bilateral: Secondary | ICD-10-CM | POA: Diagnosis not present

## 2016-01-11 DIAGNOSIS — E119 Type 2 diabetes mellitus without complications: Secondary | ICD-10-CM | POA: Diagnosis not present

## 2016-01-11 DIAGNOSIS — H2512 Age-related nuclear cataract, left eye: Secondary | ICD-10-CM | POA: Diagnosis not present

## 2016-01-11 DIAGNOSIS — H401133 Primary open-angle glaucoma, bilateral, severe stage: Secondary | ICD-10-CM | POA: Diagnosis not present

## 2016-02-17 DIAGNOSIS — E034 Atrophy of thyroid (acquired): Secondary | ICD-10-CM | POA: Diagnosis not present

## 2016-02-17 DIAGNOSIS — E782 Mixed hyperlipidemia: Secondary | ICD-10-CM | POA: Diagnosis not present

## 2016-02-17 DIAGNOSIS — E10649 Type 1 diabetes mellitus with hypoglycemia without coma: Secondary | ICD-10-CM | POA: Diagnosis not present

## 2016-02-17 DIAGNOSIS — E1065 Type 1 diabetes mellitus with hyperglycemia: Secondary | ICD-10-CM | POA: Diagnosis not present

## 2016-03-20 ENCOUNTER — Other Ambulatory Visit: Payer: Self-pay

## 2016-03-20 DIAGNOSIS — Z1231 Encounter for screening mammogram for malignant neoplasm of breast: Secondary | ICD-10-CM

## 2016-03-21 DIAGNOSIS — Z1159 Encounter for screening for other viral diseases: Secondary | ICD-10-CM | POA: Diagnosis not present

## 2016-03-21 DIAGNOSIS — E785 Hyperlipidemia, unspecified: Secondary | ICD-10-CM | POA: Diagnosis not present

## 2016-03-21 DIAGNOSIS — Z23 Encounter for immunization: Secondary | ICD-10-CM | POA: Diagnosis not present

## 2016-03-21 DIAGNOSIS — E039 Hypothyroidism, unspecified: Secondary | ICD-10-CM | POA: Diagnosis not present

## 2016-04-10 DIAGNOSIS — E1065 Type 1 diabetes mellitus with hyperglycemia: Secondary | ICD-10-CM | POA: Diagnosis not present

## 2016-04-13 DIAGNOSIS — E1065 Type 1 diabetes mellitus with hyperglycemia: Secondary | ICD-10-CM | POA: Diagnosis not present

## 2016-04-14 DIAGNOSIS — E109 Type 1 diabetes mellitus without complications: Secondary | ICD-10-CM | POA: Diagnosis not present

## 2016-04-24 ENCOUNTER — Ambulatory Visit
Admission: RE | Admit: 2016-04-24 | Discharge: 2016-04-24 | Disposition: A | Discharge status: Home / Self Care | Payer: Medicare HMO | Source: Ambulatory Visit

## 2016-04-24 ENCOUNTER — Other Ambulatory Visit: Payer: Self-pay | Admitting: Internal Medicine

## 2016-04-24 DIAGNOSIS — Z1231 Encounter for screening mammogram for malignant neoplasm of breast: Secondary | ICD-10-CM

## 2016-05-09 DIAGNOSIS — H2512 Age-related nuclear cataract, left eye: Secondary | ICD-10-CM | POA: Diagnosis not present

## 2016-05-09 DIAGNOSIS — E119 Type 2 diabetes mellitus without complications: Secondary | ICD-10-CM | POA: Diagnosis not present

## 2016-05-09 DIAGNOSIS — H40013 Open angle with borderline findings, low risk, bilateral: Secondary | ICD-10-CM | POA: Diagnosis not present

## 2016-05-09 DIAGNOSIS — H401133 Primary open-angle glaucoma, bilateral, severe stage: Secondary | ICD-10-CM | POA: Diagnosis not present

## 2016-05-22 DIAGNOSIS — E034 Atrophy of thyroid (acquired): Secondary | ICD-10-CM | POA: Diagnosis not present

## 2016-05-22 DIAGNOSIS — E10649 Type 1 diabetes mellitus with hypoglycemia without coma: Secondary | ICD-10-CM | POA: Diagnosis not present

## 2016-05-22 DIAGNOSIS — E782 Mixed hyperlipidemia: Secondary | ICD-10-CM | POA: Diagnosis not present

## 2016-07-05 DIAGNOSIS — E10649 Type 1 diabetes mellitus with hypoglycemia without coma: Secondary | ICD-10-CM | POA: Diagnosis not present

## 2016-07-05 DIAGNOSIS — E1065 Type 1 diabetes mellitus with hyperglycemia: Secondary | ICD-10-CM | POA: Diagnosis not present

## 2016-07-17 DIAGNOSIS — E109 Type 1 diabetes mellitus without complications: Secondary | ICD-10-CM | POA: Diagnosis not present

## 2016-08-18 DIAGNOSIS — R69 Illness, unspecified: Secondary | ICD-10-CM | POA: Diagnosis not present

## 2016-08-22 DIAGNOSIS — E039 Hypothyroidism, unspecified: Secondary | ICD-10-CM | POA: Diagnosis not present

## 2016-08-22 DIAGNOSIS — E10649 Type 1 diabetes mellitus with hypoglycemia without coma: Secondary | ICD-10-CM | POA: Diagnosis not present

## 2016-08-22 DIAGNOSIS — E1065 Type 1 diabetes mellitus with hyperglycemia: Secondary | ICD-10-CM | POA: Diagnosis not present

## 2016-08-22 DIAGNOSIS — E782 Mixed hyperlipidemia: Secondary | ICD-10-CM | POA: Diagnosis not present

## 2016-08-24 DIAGNOSIS — L299 Pruritus, unspecified: Secondary | ICD-10-CM | POA: Diagnosis not present

## 2016-10-16 DIAGNOSIS — E10649 Type 1 diabetes mellitus with hypoglycemia without coma: Secondary | ICD-10-CM | POA: Diagnosis not present

## 2016-10-17 DIAGNOSIS — E10649 Type 1 diabetes mellitus with hypoglycemia without coma: Secondary | ICD-10-CM | POA: Diagnosis not present

## 2016-10-17 DIAGNOSIS — E109 Type 1 diabetes mellitus without complications: Secondary | ICD-10-CM | POA: Diagnosis not present

## 2016-10-31 DIAGNOSIS — H40013 Open angle with borderline findings, low risk, bilateral: Secondary | ICD-10-CM | POA: Diagnosis not present

## 2016-10-31 DIAGNOSIS — H401133 Primary open-angle glaucoma, bilateral, severe stage: Secondary | ICD-10-CM | POA: Diagnosis not present

## 2016-10-31 DIAGNOSIS — E119 Type 2 diabetes mellitus without complications: Secondary | ICD-10-CM | POA: Diagnosis not present

## 2016-10-31 DIAGNOSIS — H43812 Vitreous degeneration, left eye: Secondary | ICD-10-CM | POA: Diagnosis not present

## 2016-11-22 DIAGNOSIS — E782 Mixed hyperlipidemia: Secondary | ICD-10-CM | POA: Diagnosis not present

## 2016-11-22 DIAGNOSIS — E039 Hypothyroidism, unspecified: Secondary | ICD-10-CM | POA: Diagnosis not present

## 2016-11-22 DIAGNOSIS — E10649 Type 1 diabetes mellitus with hypoglycemia without coma: Secondary | ICD-10-CM | POA: Diagnosis not present

## 2016-11-29 DIAGNOSIS — H40013 Open angle with borderline findings, low risk, bilateral: Secondary | ICD-10-CM | POA: Diagnosis not present

## 2016-11-29 DIAGNOSIS — H401133 Primary open-angle glaucoma, bilateral, severe stage: Secondary | ICD-10-CM | POA: Diagnosis not present

## 2016-12-27 DIAGNOSIS — R69 Illness, unspecified: Secondary | ICD-10-CM | POA: Diagnosis not present

## 2017-01-29 DIAGNOSIS — E10649 Type 1 diabetes mellitus with hypoglycemia without coma: Secondary | ICD-10-CM | POA: Diagnosis not present

## 2017-02-19 DIAGNOSIS — R69 Illness, unspecified: Secondary | ICD-10-CM | POA: Diagnosis not present

## 2017-02-20 DIAGNOSIS — E10649 Type 1 diabetes mellitus with hypoglycemia without coma: Secondary | ICD-10-CM | POA: Diagnosis not present

## 2017-02-27 DIAGNOSIS — E782 Mixed hyperlipidemia: Secondary | ICD-10-CM | POA: Diagnosis not present

## 2017-02-27 DIAGNOSIS — E10649 Type 1 diabetes mellitus with hypoglycemia without coma: Secondary | ICD-10-CM | POA: Diagnosis not present

## 2017-02-27 DIAGNOSIS — E039 Hypothyroidism, unspecified: Secondary | ICD-10-CM | POA: Diagnosis not present

## 2017-03-05 DIAGNOSIS — E119 Type 2 diabetes mellitus without complications: Secondary | ICD-10-CM | POA: Diagnosis not present

## 2017-03-05 DIAGNOSIS — H2512 Age-related nuclear cataract, left eye: Secondary | ICD-10-CM | POA: Diagnosis not present

## 2017-03-05 DIAGNOSIS — H40013 Open angle with borderline findings, low risk, bilateral: Secondary | ICD-10-CM | POA: Diagnosis not present

## 2017-03-05 DIAGNOSIS — H401133 Primary open-angle glaucoma, bilateral, severe stage: Secondary | ICD-10-CM | POA: Diagnosis not present

## 2017-03-06 ENCOUNTER — Emergency Department
Admission: EM | Admit: 2017-03-06 | Discharge: 2017-03-06 | Disposition: A | Discharge status: Home / Self Care | Payer: Medicare HMO | Source: Home / Self Care | Attending: Family Medicine | Admitting: Family Medicine

## 2017-03-06 ENCOUNTER — Emergency Department (INDEPENDENT_AMBULATORY_CARE_PROVIDER_SITE_OTHER): Payer: Medicare HMO

## 2017-03-06 ENCOUNTER — Encounter: Payer: Self-pay | Admitting: *Deleted

## 2017-03-06 DIAGNOSIS — S61212A Laceration without foreign body of right middle finger without damage to nail, initial encounter: Secondary | ICD-10-CM | POA: Diagnosis not present

## 2017-03-06 DIAGNOSIS — S61214A Laceration without foreign body of right ring finger without damage to nail, initial encounter: Secondary | ICD-10-CM

## 2017-03-06 DIAGNOSIS — W268XXA Contact with other sharp object(s), not elsewhere classified, initial encounter: Secondary | ICD-10-CM

## 2017-03-06 HISTORY — DX: Type 2 diabetes mellitus without complications: E11.9

## 2017-03-06 HISTORY — DX: Hyperlipidemia, unspecified: E78.5

## 2017-03-06 HISTORY — DX: Disorder of thyroid, unspecified: E07.9

## 2017-03-06 NOTE — ED Triage Notes (Signed)
Pt c/o RT 3rd and 4th finger laceration this afternoon after putting her hand in a kitchen chopper at home.

## 2017-03-06 NOTE — ED Provider Notes (Signed)
[facility]    CSN: [id number] Arrival date & time: 03/06/17  1753     History   Chief Complaint Chief Complaint  Patient presents with  . Extremity Laceration    HPI Nichole [name] is a 71 y.o. female.   Patient accidentally lacerated her right 3rd and 4th fingers on an electric food shopper at home about one hour ago.  Her last Tdap was 10/30/10.   The history is provided by the patient and the spouse.  Laceration  Location:  Finger Finger laceration location:  R middle finger and R ring finger Length:  3cm total Depth:  Through dermis Quality: straight   Bleeding: controlled   Time since incident:  1 hour Laceration mechanism:  Metal edge Pain details:    Quality:  Aching   Timing:  Constant   Progression:  Unchanged Foreign body present:  No foreign bodies Worsened by:  Movement Ineffective treatments:  None tried Tetanus status:  Up to date Associated symptoms: no swelling     Past Medical History:  Diagnosis Date  . Diabetes mellitus without complication ([place])   . Hyperlipidemia   . Thyroid disease     Patient Active Problem List   Diagnosis Date Noted  . HYPOTHYROIDISM 04/21/2009  . Type I (juvenile type) diabetes mellitus without mention of complication, uncontrolled 04/21/2009  . HYPERLIPIDEMIA 04/21/2009    History reviewed. No pertinent surgical history.  OB History    No data available       Home Medications    Prior to Admission medications   Medication Sig Start Date End Date Taking? Authorizing Provider  aspirin 81 MG tablet Take 81 mg by mouth daily.    [provider]  atorvastatin (LIPITOR) 20 MG tablet Take 20 mg by mouth daily.    [provider]  BAYER CONTOUR NEXT TEST test strip  07/08/13   [provider]  BAYER MICROLET LANCETS lancets  04/15/13   [provider]  Cholecalciferol (VITAMIN D PO) Take 500 Units by mouth daily.    [provider]  FLUVIRIN INJ injection   08/19/13   [provider]  glucagon 1 MG injection Inject 1 mg into the vein once as needed. 06/04/13   Elayne Snare, MD  insulin aspart (NOVOLOG) 100 UNIT/ML SOCT cartridge Inject into the skin 3 (three) times daily with meals. Max 35 units    [provider]  levothyroxine (SYNTHROID, LEVOTHROID) 75 MCG tablet Take 75 mcg by mouth daily before breakfast.    [provider]  Tetrahydrozoline-Zn Sulfate (EYE DROPS A/C OP) Apply to eye. One drop in each eye daily    [provider]  timolol (TIMOPTIC) 0.5 % ophthalmic solution  07/08/13   [provider]  traMADol (ULTRAM) 50 MG tablet Take 1 tablet (50 mg total) by mouth every 6 (six) hours as needed. 07/30/14   Janeann Forehand, MD    Family History History reviewed. No pertinent family history.  Social History Social History  Substance Use Topics  . Smoking status: Never Smoker  . Smokeless tobacco: Never Used  . Alcohol use No     Allergies   Patient has no known allergies.   Review of Systems Review of Systems  All other systems reviewed and are negative.    Physical Exam Triage Vital Signs ED Triage Vitals  Enc Vitals Group     BP 03/06/17 1830 (!) 151/77     Pulse Rate 03/06/17 1830 77  Resp 03/06/17 1830 16     Temp 03/06/17 1830 98.4 F (36.9 C)     Temp Source 03/06/17 1830 Oral     SpO2 03/06/17 1830 97 %     Weight --      Height --      Head Circumference --      Peak Flow --      Pain Score 03/06/17 1831 3     Pain Loc --      Pain Edu? --      Excl. in [place]? --    No data found.   Updated Vital Signs BP (!) 151/77 (BP Location: Left Arm)   Pulse 77   Temp 98.4 F (36.9 C) (Oral)   Resp 16   SpO2 97%   Visual Acuity Right Eye Distance:   Left Eye Distance:   Bilateral Distance:    Right Eye Near:   Left Eye Near:    Bilateral Near:     Physical Exam  Constitutional: She appears well-developed and well-nourished. No distress.  HENT:    Head: Atraumatic.  Eyes: Pupils are equal, round, and reactive to light.  Cardiovascular: Normal rate.   Pulmonary/Chest: Effort normal.  Musculoskeletal:       Right hand: She exhibits laceration. She exhibits normal range of motion, normal two-point discrimination and normal capillary refill.       Hands: Palmar surface of right 4th finger distal phalanx has a simple 1.5cm linear laceration as noted on diagram.  Distal phalanx has normal flexion/extension.  Palmar surface of right 3rd finger distal phalanx has 2 parallel linear lacerations:  1cm long and 0.5cm long.  Distal phalanx has normal flexion/extension.  Neurological: She is alert.  Skin: Skin is warm and dry.  Nursing note and vitals reviewed.    UC Treatments / Results  Labs (all labs ordered are listed, but only abnormal results are displayed) Labs Reviewed - No data to display  EKG  EKG Interpretation None       Radiology Dg Hand Complete Right  Result Date: 03/06/2017 CLINICAL DATA:  Laceration to third and fourth fingers. EXAM: RIGHT HAND - COMPLETE 3+ VIEW COMPARISON:  None. FINDINGS: There is no evidence of fracture or dislocation. There is no evidence of arthropathy or other focal bone abnormality. No radiopaque foreign body. IMPRESSION: No acute osseous abnormality of the right hand. Electronically Signed   By: Ulyses Jarred M.D.   On: 03/06/2017 18:27    Procedures Procedures Laceration Repair Discussed benefits and risks of procedure and verbal consent obtained. Using sterile technique and digital anesthesia with 2% lidocaine without epinephrine, cleansed wounds with Betadine followed by copious lavage with normal saline.  Wounds carefully inspected for debris and foreign bodies; none found.  Wound on right 4th finger closed with #4, 4-0 interrupted nylon sutures, and two wounds on right 3rd finger closed with total of #3, 4-0 nylon interrupted sutures..  Bacitracin and non-stick sterile dressings applied.   Wound precautions explained to patient.  Return for suture removal in 10 days.   Medications Ordered in UC Medications - No data to display   Initial Impression / Assessment and Plan / UC Course  I have reviewed the triage vital signs and the nursing notes.  Pertinent labs & imaging results that were available during my care of the patient were reviewed by me and considered in my medical decision making (see chart for details).    Change dressings daily and apply Bacitracin ointment to wounds.  Keep wounds clean and dry.  Return for any signs of infection (or follow-up with family doctor):  Increasing redness, swelling, pain, heat, drainage, etc. Return in 10 days for suture removal.       Final Clinical Impressions(s) / UC Diagnoses   Final diagnoses:  Laceration of right ring finger without foreign body without damage to nail, initial encounter  Laceration of right middle finger without foreign body without damage to nail, initial encounter    New Prescriptions New Prescriptions   No medications on file     Kandra Nicolas, MD 03/06/17 1939

## 2017-03-06 NOTE — Discharge Instructions (Signed)
Change dressing daily and apply Bacitracin ointment to wound.  Keep wound clean and dry.  Return for any signs of infection (or follow-up with family doctor):  Increasing redness, swelling, pain, heat, drainage, etc. °Return in 10 days for suture removal.   °

## 2017-03-14 DIAGNOSIS — R69 Illness, unspecified: Secondary | ICD-10-CM | POA: Diagnosis not present

## 2017-03-16 ENCOUNTER — Emergency Department (INDEPENDENT_AMBULATORY_CARE_PROVIDER_SITE_OTHER)
Admission: EM | Admit: 2017-03-16 | Discharge: 2017-03-16 | Disposition: A | Discharge status: Home / Self Care | Payer: Medicare HMO | Source: Home / Self Care | Attending: Family Medicine | Admitting: Family Medicine

## 2017-03-16 DIAGNOSIS — Z4802 Encounter for removal of sutures: Secondary | ICD-10-CM

## 2017-03-16 NOTE — ED Triage Notes (Signed)
Pt is also concerned because right hand has a darker coloration then the left.  Spoke with PA regarding.

## 2017-03-16 NOTE — ED Provider Notes (Signed)
Vitals:   03/16/17 1345  BP: (!) 161/82  Pulse: 68  Temp: 97.7 F (36.5 C)    Pt presenting to [facility] for suture removal after having sutures placed on 02/04/17 at this facility.  She notes she had mild bruising to back of Right hand on 02/05/17 that has gradually improved since onset. No other concerns. Denies fever or chills. Denies worsening pain. She notes her CBG has been good, pt is a Type 1 diabetic.  Wounds to Right 3rd and 4th fingers appear to be healing well with scan amount of dried blood on 4th finger. No erythema or warmth.  Right hand, dorsal aspect over 3rd and 4th metacarpal joints: faint ecchymosis, no edema. erythema or warmth. Non-tender.   Right 3rd finger: skin is clean and dry. Three 4-0 prolene sutures in placed. Removed w/o complication. Bacitracin and bandage applied. Right 4th finger: skin is clean and dry. Four 4-0 prolene sutures in place. All 4 sutures removed w/o complication. Bacitracin and bandage applied.  No immediate complications.  Home care instructions provided.  F/u with PCP or return to UC if needed.    Noland Fordyce, PA-C 03/16/17 [phone]

## 2017-03-16 NOTE — ED Triage Notes (Signed)
Pt cut middle and ring finger of the right hand on May 8th.  3 sutures in 1 finger 4 in the other to be removed.

## 2017-03-19 ENCOUNTER — Other Ambulatory Visit: Payer: Self-pay | Admitting: Internal Medicine

## 2017-03-19 DIAGNOSIS — Z1231 Encounter for screening mammogram for malignant neoplasm of breast: Secondary | ICD-10-CM

## 2017-03-20 DIAGNOSIS — E785 Hyperlipidemia, unspecified: Secondary | ICD-10-CM | POA: Diagnosis not present

## 2017-03-20 DIAGNOSIS — E039 Hypothyroidism, unspecified: Secondary | ICD-10-CM | POA: Diagnosis not present

## 2017-03-20 DIAGNOSIS — Z1231 Encounter for screening mammogram for malignant neoplasm of breast: Secondary | ICD-10-CM | POA: Diagnosis not present

## 2017-03-20 DIAGNOSIS — Z13820 Encounter for screening for osteoporosis: Secondary | ICD-10-CM | POA: Diagnosis not present

## 2017-04-13 ENCOUNTER — Other Ambulatory Visit: Payer: Self-pay | Admitting: Internal Medicine

## 2017-04-13 DIAGNOSIS — R5381 Other malaise: Secondary | ICD-10-CM

## 2017-04-24 DIAGNOSIS — E10649 Type 1 diabetes mellitus with hypoglycemia without coma: Secondary | ICD-10-CM | POA: Diagnosis not present

## 2017-04-25 ENCOUNTER — Ambulatory Visit: Payer: Medicare HMO

## 2017-04-27 DIAGNOSIS — R030 Elevated blood-pressure reading, without diagnosis of hypertension: Secondary | ICD-10-CM | POA: Diagnosis not present

## 2017-04-27 DIAGNOSIS — H6982 Other specified disorders of Eustachian tube, left ear: Secondary | ICD-10-CM | POA: Diagnosis not present

## 2017-04-27 DIAGNOSIS — H6502 Acute serous otitis media, left ear: Secondary | ICD-10-CM | POA: Diagnosis not present

## 2017-05-04 ENCOUNTER — Other Ambulatory Visit: Payer: Self-pay | Admitting: Internal Medicine

## 2017-05-04 DIAGNOSIS — M8580 Other specified disorders of bone density and structure, unspecified site: Secondary | ICD-10-CM

## 2017-05-07 ENCOUNTER — Ambulatory Visit
Admission: RE | Admit: 2017-05-07 | Discharge: 2017-05-07 | Disposition: A | Discharge status: Home / Self Care | Payer: Medicare HMO | Source: Ambulatory Visit | Attending: Internal Medicine | Admitting: Internal Medicine

## 2017-05-07 DIAGNOSIS — Z780 Asymptomatic menopausal state: Secondary | ICD-10-CM | POA: Diagnosis not present

## 2017-05-07 DIAGNOSIS — Z1231 Encounter for screening mammogram for malignant neoplasm of breast: Secondary | ICD-10-CM

## 2017-05-07 DIAGNOSIS — M8580 Other specified disorders of bone density and structure, unspecified site: Secondary | ICD-10-CM

## 2017-05-07 DIAGNOSIS — M85852 Other specified disorders of bone density and structure, left thigh: Secondary | ICD-10-CM | POA: Diagnosis not present

## 2017-05-16 DIAGNOSIS — H6980 Other specified disorders of Eustachian tube, unspecified ear: Secondary | ICD-10-CM | POA: Diagnosis not present

## 2017-05-23 DIAGNOSIS — R69 Illness, unspecified: Secondary | ICD-10-CM | POA: Diagnosis not present

## 2017-05-25 DIAGNOSIS — E10649 Type 1 diabetes mellitus with hypoglycemia without coma: Secondary | ICD-10-CM | POA: Diagnosis not present

## 2017-05-29 DIAGNOSIS — Z86010 Personal history of colonic polyps: Secondary | ICD-10-CM | POA: Diagnosis not present

## 2017-05-29 DIAGNOSIS — Z1211 Encounter for screening for malignant neoplasm of colon: Secondary | ICD-10-CM | POA: Diagnosis not present

## 2017-06-11 DIAGNOSIS — E782 Mixed hyperlipidemia: Secondary | ICD-10-CM | POA: Diagnosis not present

## 2017-06-11 DIAGNOSIS — E10649 Type 1 diabetes mellitus with hypoglycemia without coma: Secondary | ICD-10-CM | POA: Diagnosis not present

## 2017-06-11 DIAGNOSIS — E039 Hypothyroidism, unspecified: Secondary | ICD-10-CM | POA: Diagnosis not present

## 2017-07-09 DIAGNOSIS — L989 Disorder of the skin and subcutaneous tissue, unspecified: Secondary | ICD-10-CM | POA: Diagnosis not present

## 2017-07-09 DIAGNOSIS — H40013 Open angle with borderline findings, low risk, bilateral: Secondary | ICD-10-CM | POA: Diagnosis not present

## 2017-07-09 DIAGNOSIS — E119 Type 2 diabetes mellitus without complications: Secondary | ICD-10-CM | POA: Diagnosis not present

## 2017-07-09 DIAGNOSIS — H2512 Age-related nuclear cataract, left eye: Secondary | ICD-10-CM | POA: Diagnosis not present

## 2017-07-09 DIAGNOSIS — H401133 Primary open-angle glaucoma, bilateral, severe stage: Secondary | ICD-10-CM | POA: Diagnosis not present

## 2017-07-24 DIAGNOSIS — R69 Illness, unspecified: Secondary | ICD-10-CM | POA: Diagnosis not present

## 2017-07-28 DIAGNOSIS — E039 Hypothyroidism, unspecified: Secondary | ICD-10-CM | POA: Diagnosis not present

## 2017-07-28 DIAGNOSIS — E782 Mixed hyperlipidemia: Secondary | ICD-10-CM | POA: Diagnosis not present

## 2017-07-28 DIAGNOSIS — E10649 Type 1 diabetes mellitus with hypoglycemia without coma: Secondary | ICD-10-CM | POA: Diagnosis not present

## 2017-07-30 DIAGNOSIS — Z86010 Personal history of colonic polyps: Secondary | ICD-10-CM | POA: Diagnosis not present

## 2017-07-30 DIAGNOSIS — E10649 Type 1 diabetes mellitus with hypoglycemia without coma: Secondary | ICD-10-CM | POA: Diagnosis not present

## 2017-07-30 DIAGNOSIS — Z1211 Encounter for screening for malignant neoplasm of colon: Secondary | ICD-10-CM | POA: Diagnosis not present

## 2017-08-08 DIAGNOSIS — R69 Illness, unspecified: Secondary | ICD-10-CM | POA: Diagnosis not present

## 2017-08-20 DIAGNOSIS — L821 Other seborrheic keratosis: Secondary | ICD-10-CM | POA: Diagnosis not present

## 2017-08-20 DIAGNOSIS — D485 Neoplasm of uncertain behavior of skin: Secondary | ICD-10-CM | POA: Diagnosis not present

## 2017-09-02 DIAGNOSIS — R69 Illness, unspecified: Secondary | ICD-10-CM | POA: Diagnosis not present

## 2017-09-12 DIAGNOSIS — E10649 Type 1 diabetes mellitus with hypoglycemia without coma: Secondary | ICD-10-CM | POA: Diagnosis not present

## 2017-09-12 DIAGNOSIS — E782 Mixed hyperlipidemia: Secondary | ICD-10-CM | POA: Diagnosis not present

## 2017-09-12 DIAGNOSIS — E039 Hypothyroidism, unspecified: Secondary | ICD-10-CM | POA: Diagnosis not present

## 2017-09-14 DIAGNOSIS — E10649 Type 1 diabetes mellitus with hypoglycemia without coma: Secondary | ICD-10-CM | POA: Diagnosis not present

## 2017-09-25 DIAGNOSIS — E785 Hyperlipidemia, unspecified: Secondary | ICD-10-CM | POA: Diagnosis not present

## 2017-09-25 DIAGNOSIS — E039 Hypothyroidism, unspecified: Secondary | ICD-10-CM | POA: Diagnosis not present

## 2017-09-25 DIAGNOSIS — E162 Hypoglycemia, unspecified: Secondary | ICD-10-CM | POA: Diagnosis not present

## 2017-09-25 DIAGNOSIS — E10649 Type 1 diabetes mellitus with hypoglycemia without coma: Secondary | ICD-10-CM | POA: Diagnosis not present

## 2017-09-26 DIAGNOSIS — E10649 Type 1 diabetes mellitus with hypoglycemia without coma: Secondary | ICD-10-CM | POA: Diagnosis not present

## 2018-05-30 ENCOUNTER — Other Ambulatory Visit: Payer: Self-pay

## 2018-05-30 DIAGNOSIS — Z1231 Encounter for screening mammogram for malignant neoplasm of breast: Secondary | ICD-10-CM

## 2018-07-23 ENCOUNTER — Ambulatory Visit
Admission: RE | Admit: 2018-07-23 | Discharge: 2018-07-23 | Disposition: A | Discharge status: Home / Self Care | Payer: Medicare HMO | Source: Ambulatory Visit | Attending: Physician Assistant | Admitting: Physician Assistant

## 2018-07-23 ENCOUNTER — Other Ambulatory Visit: Payer: Self-pay | Admitting: Family Medicine

## 2018-07-23 DIAGNOSIS — Z1231 Encounter for screening mammogram for malignant neoplasm of breast: Secondary | ICD-10-CM

## 2019-01-16 IMAGING — MG DIGITAL SCREENING BILATERAL MAMMOGRAM WITH CAD
4 series · 4 of 4 positions shown · non-contrast
Comparison: Previous exam(s).

CLINICAL DATA: Screening.

EXAM:
DIGITAL SCREENING BILATERAL MAMMOGRAM WITH CAD

[L CC]
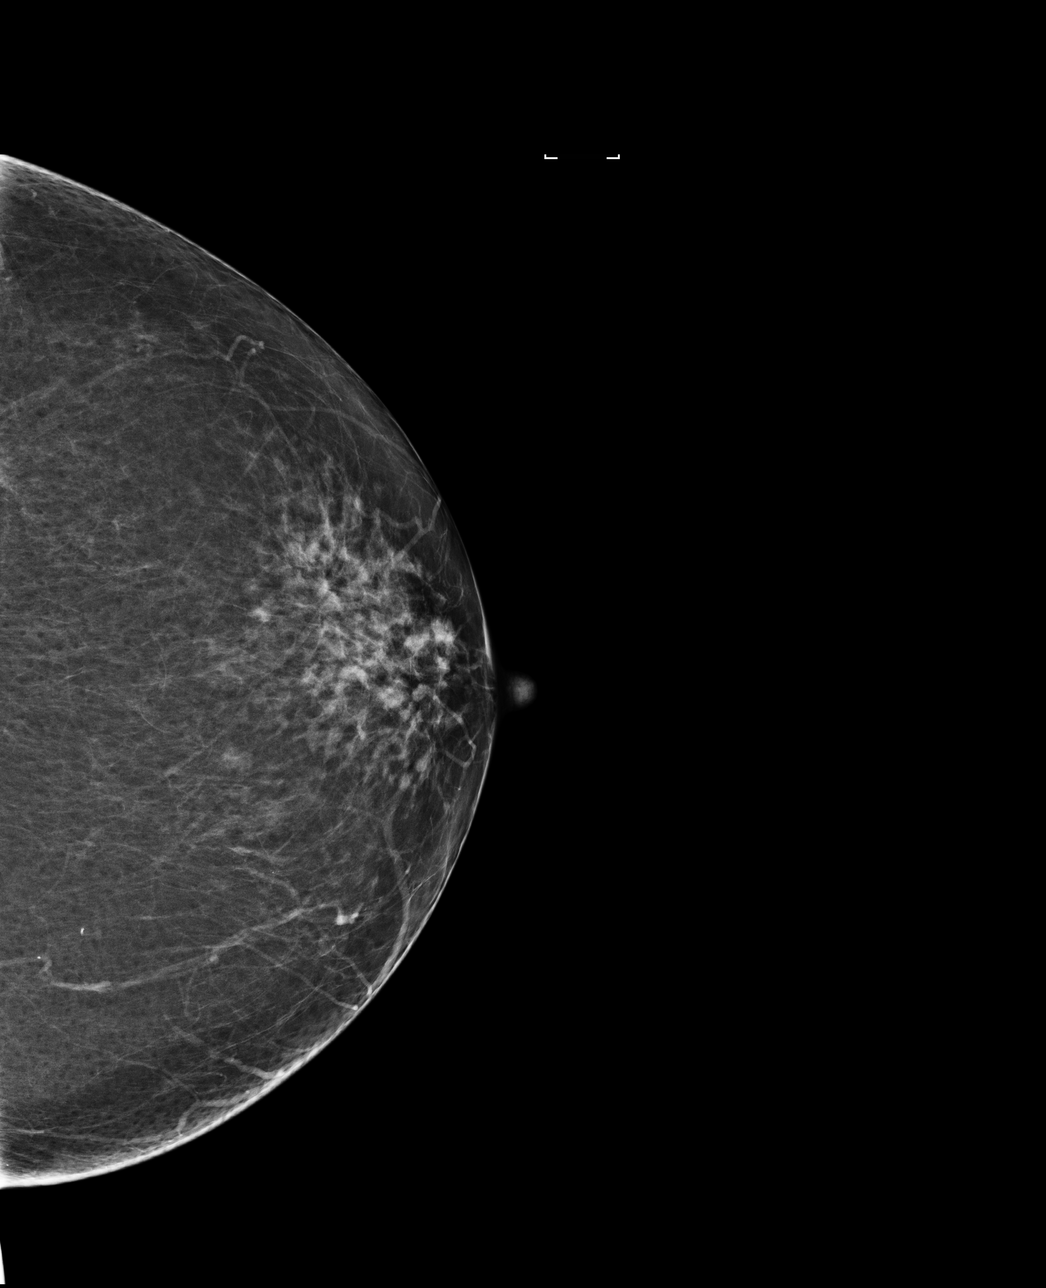

[L MLO]
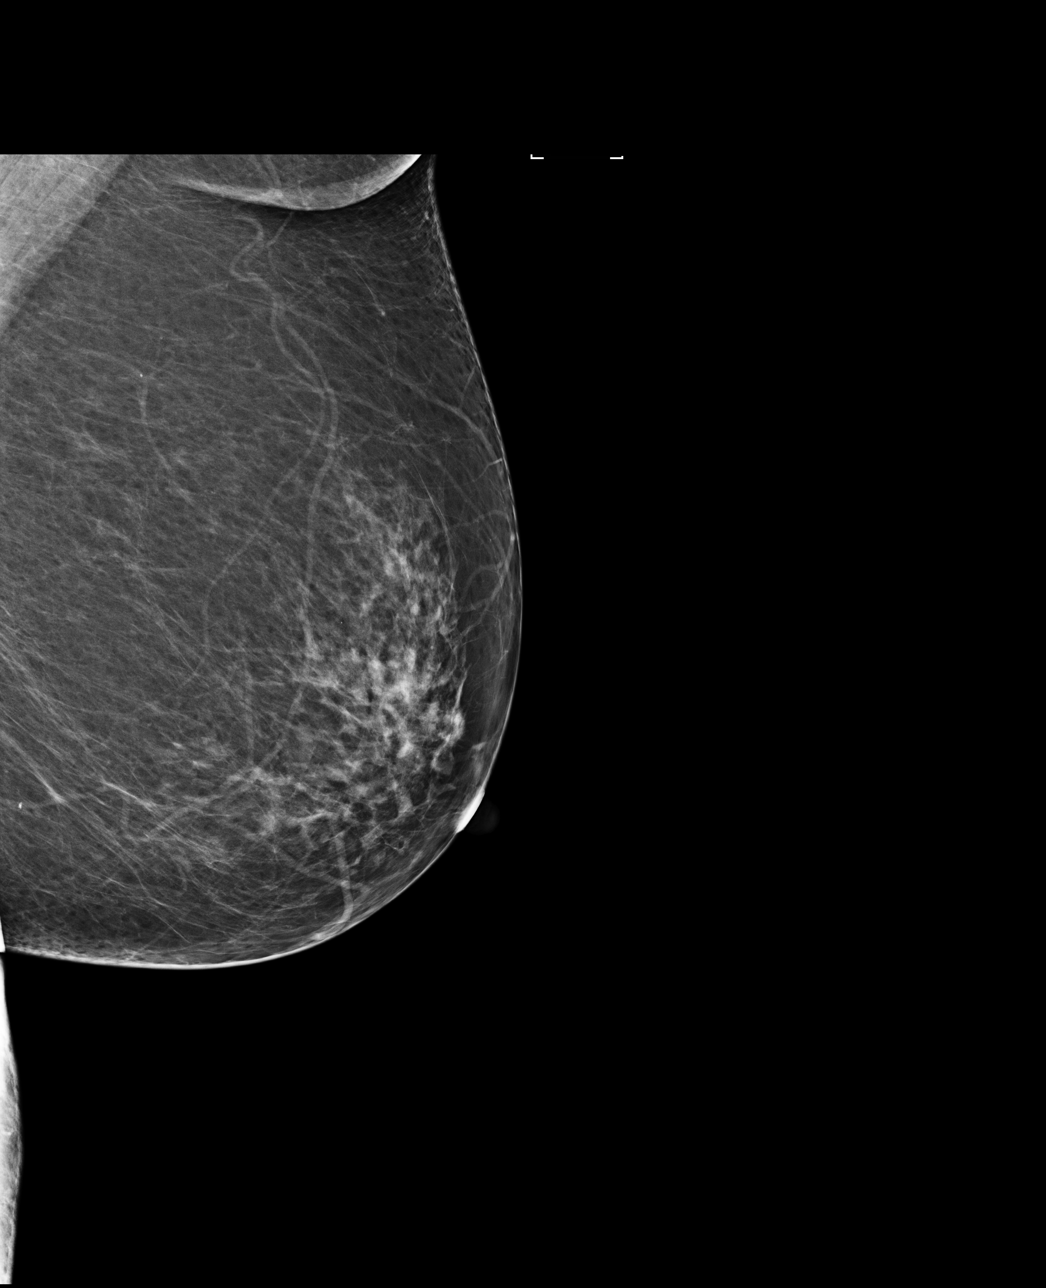

[R MLO]
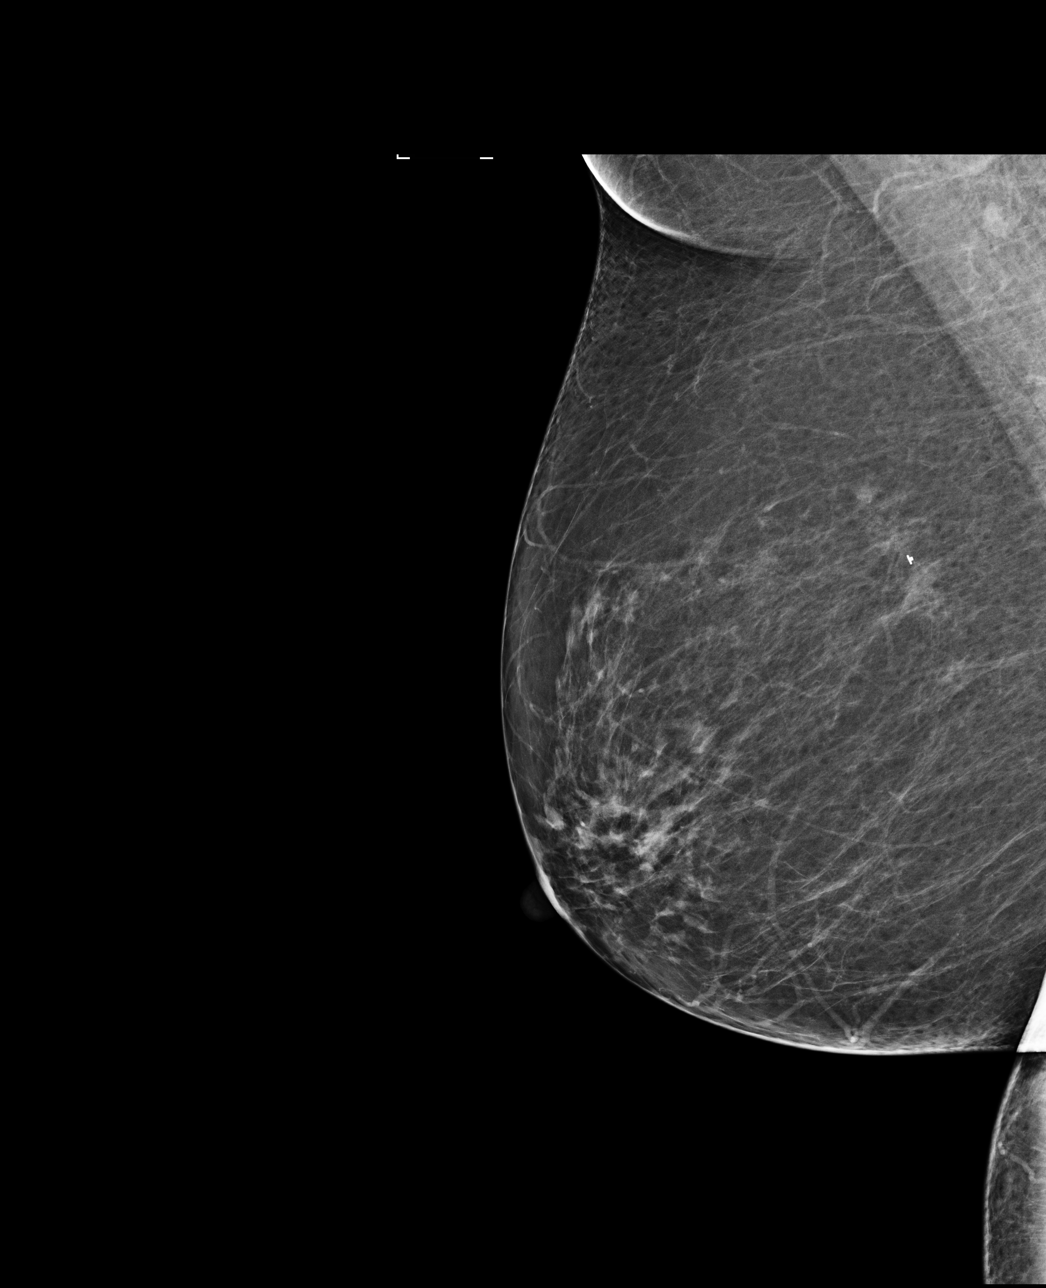

[R CC]
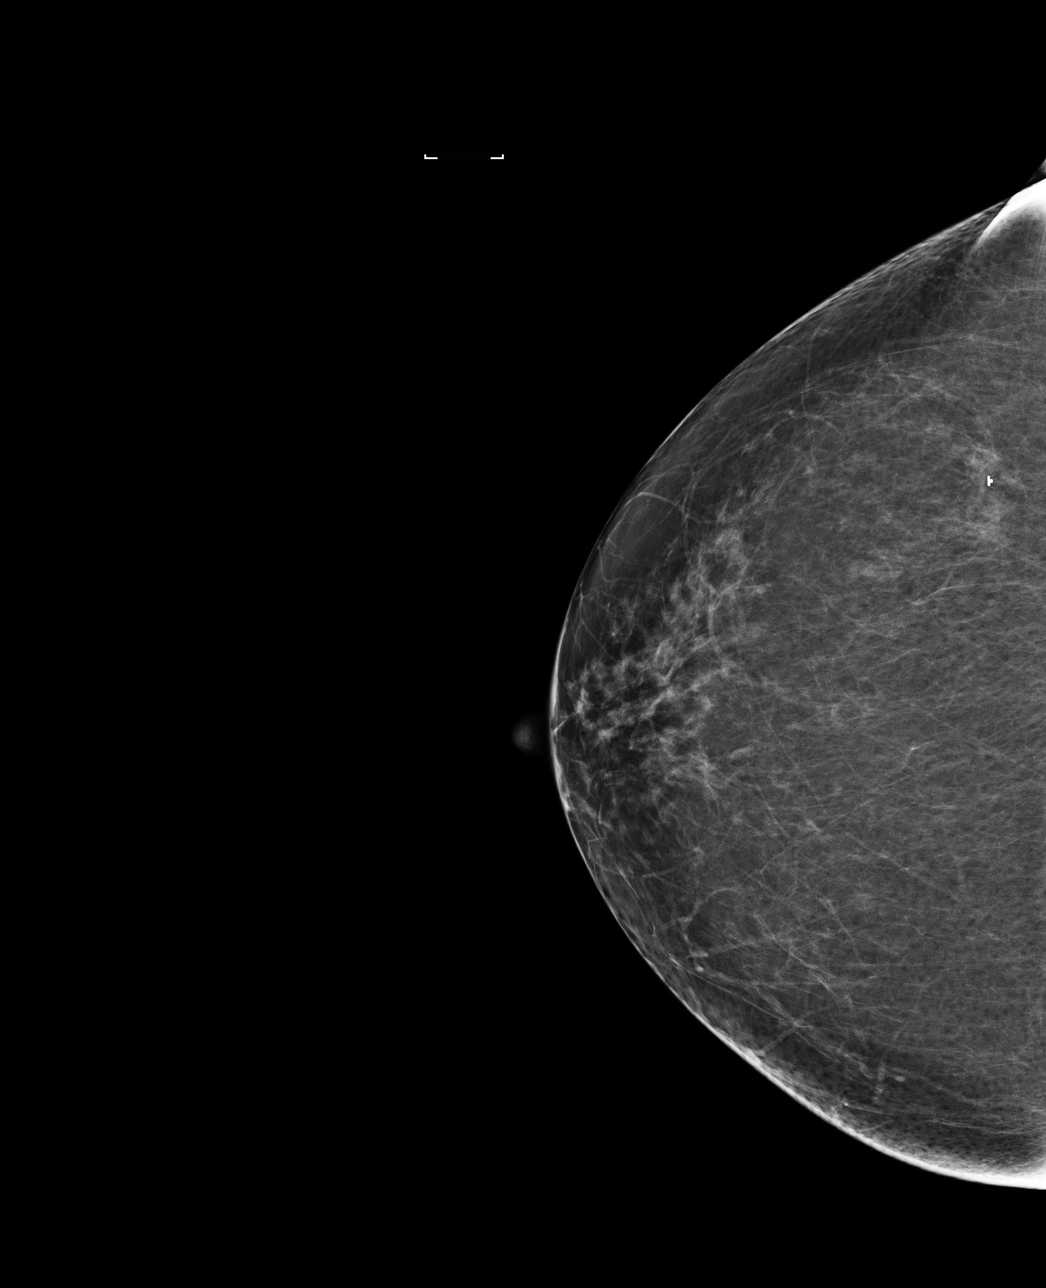

[4 of 4 positions shown; findings below may reference images not displayed]

ACR Breast Density Category b: There are scattered areas of
fibroglandular density.
FINDINGS: There are no findings suspicious for malignancy. Images were
processed with CAD.
IMPRESSION: No mammographic evidence of malignancy. A result letter of this
screening mammogram will be mailed directly to the patient.

RECOMMENDATION:
Screening mammogram in one year. (Code:AS-G-LCT)

BI-RADS CATEGORY  1: Negative.

## 2020-07-08 ENCOUNTER — Other Ambulatory Visit: Payer: Self-pay

## 2020-07-08 ENCOUNTER — Encounter (INDEPENDENT_AMBULATORY_CARE_PROVIDER_SITE_OTHER): Payer: Self-pay | Admitting: Ophthalmology

## 2020-07-08 ENCOUNTER — Ambulatory Visit (INDEPENDENT_AMBULATORY_CARE_PROVIDER_SITE_OTHER): Payer: Medicare HMO | Admitting: Ophthalmology

## 2020-07-08 DIAGNOSIS — H2512 Age-related nuclear cataract, left eye: Secondary | ICD-10-CM | POA: Insufficient documentation

## 2020-07-08 DIAGNOSIS — H40013 Open angle with borderline findings, low risk, bilateral: Secondary | ICD-10-CM | POA: Insufficient documentation

## 2020-07-08 DIAGNOSIS — H4010X2 Unspecified open-angle glaucoma, moderate stage: Secondary | ICD-10-CM | POA: Insufficient documentation

## 2020-07-08 DIAGNOSIS — E119 Type 2 diabetes mellitus without complications: Secondary | ICD-10-CM | POA: Diagnosis not present

## 2020-07-08 DIAGNOSIS — H35341 Macular cyst, hole, or pseudohole, right eye: Secondary | ICD-10-CM | POA: Diagnosis not present

## 2020-07-08 DIAGNOSIS — H401132 Primary open-angle glaucoma, bilateral, moderate stage: Secondary | ICD-10-CM | POA: Insufficient documentation

## 2020-07-08 DIAGNOSIS — H401112 Primary open-angle glaucoma, right eye, moderate stage: Secondary | ICD-10-CM

## 2020-07-08 NOTE — Assessment & Plan Note (Signed)

## 2020-07-08 NOTE — Assessment & Plan Note (Signed)

## 2020-07-08 NOTE — Progress Notes (Signed)
07/08/2020     CHIEF COMPLAINT Patient presents for Diabetic Eye Exam   HISTORY OF PRESENT ILLNESS: Nichole [name] is a 74 y.o. female who presents to the clinic today for:   HPI    Diabetic Eye Exam    Vision is stable.  Associated Symptoms Negative for Flashes and Floaters.  Diabetes characteristics include Type 2.  Blood sugar level is controlled.  Last Blood Glucose 102.  Last A1C 6.5.  I, the attending physician,  performed the HPI with the patient and updated documentation appropriately.          Comments    7 Month Diabetic Exam OU. OCT  Pt states vision is doing well. Denies FOL and floaters. Pt is using gtts as directed.       Last edited by Tilda Franco on 07/08/2020  9:00 AM. (History)      Referring physician: Wenda Low, MD [address] [place],  [place] [address]  HISTORICAL INFORMATION:   Selected notes from the MEDICAL RECORD NUMBER    Lab Results  Component Value Date   HGBA1C 8.6 (H) 07/08/2013     CURRENT MEDICATIONS: Current Outpatient Medications (Ophthalmic Drugs)  Medication Sig  . Tetrahydrozoline-Zn Sulfate (EYE DROPS A/C OP) Apply to eye. One drop in each eye daily  . timolol (TIMOPTIC) 0.5 % ophthalmic solution    No current facility-administered medications for this visit. (Ophthalmic Drugs)   Current Outpatient Medications (Other)  Medication Sig  . aspirin 81 MG tablet Take 81 mg by mouth daily.  Marland Kitchen atorvastatin (LIPITOR) 20 MG tablet Take 20 mg by mouth daily.  Marland Kitchen BAYER CONTOUR NEXT TEST test strip   . BAYER MICROLET LANCETS lancets   . Cholecalciferol (VITAMIN D PO) Take 500 Units by mouth daily.  Marland Kitchen FLUVIRIN INJ injection   . glucagon 1 MG injection Inject 1 mg into the vein once as needed.  . insulin aspart (NOVOLOG) 100 UNIT/ML SOCT cartridge Inject into the skin 3 (three) times daily with meals. Max 35 units  . levothyroxine (SYNTHROID, LEVOTHROID) 75 MCG tablet Take 75 mcg by mouth daily before breakfast.    . traMADol (ULTRAM) 50 MG tablet Take 1 tablet (50 mg total) by mouth every 6 (six) hours as needed.   No current facility-administered medications for this visit. (Other)      REVIEW OF SYSTEMS: ROS    Positive for: Endocrine   Last edited by Tilda Franco on 07/08/2020  9:00 AM. (History)       ALLERGIES No Known Allergies  PAST MEDICAL HISTORY Past Medical History:  Diagnosis Date  . Diabetes mellitus without complication ([place])   . Hyperlipidemia   . Thyroid disease    Past Surgical History:  Procedure Laterality Date  . BREAST BIOPSY Right 2015   benign    FAMILY HISTORY History reviewed. No pertinent family history.  SOCIAL HISTORY Social History   Tobacco Use  . Smoking status: Never Smoker  . Smokeless tobacco: Never Used  Vaping Use  . Vaping Use: Never used  Substance Use Topics  . Alcohol use: No  . Drug use: No         OPHTHALMIC EXAM:  Base Eye Exam    Visual Acuity (Snellen - Linear)      Right Left   Dist [place] 20/40 -2 20/25 -2   Dist ph  20/25 -2        Tonometry (Tonopen, 9:05 AM)  Right Left   Pressure 15 14       Pupils      Pupils Dark Light Shape React APD   Right PERRL 4 3 Round Brisk None   Left PERRL 4 3 Round Brisk None       Visual Fields (Counting fingers)      Left Right    Full Full       Neuro/Psych    Oriented x3: Yes   Mood/Affect: Normal       Dilation    Both eyes: 1.0% Mydriacyl, 2.5% Phenylephrine @ 9:05 AM        Slit Lamp and Fundus Exam    External Exam      Right Left   External Normal Normal       Slit Lamp Exam      Right Left   Lids/Lashes Normal Normal   Conjunctiva/Sclera White and quiet White and quiet   Cornea Clear Clear   Anterior Chamber Deep and quiet Deep and quiet   Iris Round and reactive Round and reactive   Anterior Vitreous Normal Normal       Fundus Exam      Right Left   Posterior Vitreous Vitrectomized, clear Posterior vitreous detachment   Disc  Normal Normal   C/D Ratio 0.7 0.7   Macula Macular hole, closed, negative Watzke sign Normal   Vessels Normal, , no DR Normal, , no DR   Periphery Normal Normal          IMAGING AND PROCEDURES  Imaging and Procedures for 07/08/20  OCT, Retina - OU - Both Eyes       Right Eye Quality was good. Scan locations included subfoveal. Central Foveal Thickness: 283. Progression has been stable. Findings include abnormal foveal contour.   Left Eye Quality was good. Scan locations included subfoveal. Central Foveal Thickness: 252. Progression has been stable. Findings include normal observations, normal foveal contour.   Notes History of macular hole right eye, status post successful closure, some variable appearance to the center of the fovea yet with excellent vision.  Clinically this appears to be a pseudohole.                ASSESSMENT/PLAN:  Nuclear sclerotic cataract of left eye The nature of cataract was discussed with the patient as well as the elective nature of surgery. The patient was reassured that surgery at a later date does not put the patient at risk for a worse outcome. It was emphasized that the need for surgery is dictated by the patient's quality of life as influenced by the cataract. Patient was instructed to maintain close follow up with their general eye care doctor.  Diabetes mellitus without complication ([place]) The patient has diabetes without any evidence of retinopathy. The patient advised to maintain good blood glucose control, excellent blood pressure control, and favorable levels of cholesterol, low density lipoprotein, and high density lipoproteins. Follow up in 1 year was recommended. Explained that fluctuations in visual acuity , or "out of focus", may result from large variations of blood sugar control.      ICD-10-CM   1. Diabetes mellitus without complication (HCC)  J24.2   2. Primary open angle glaucoma (POAG) of right eye, moderate stage   H40.1112   3. Nuclear sclerotic cataract of left eye  H25.12   4. 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 Primary open angle glaucoma of both eyes, moderate  stage  H40.1132   6. Macular pseudohole of right eye  H35.341 OCT, Retina - OU - Both Eyes    1.  Open-angle glaucoma continues to be stable in each eye.  2.  No diabetic retinopathy detected in either eye.  3.  Patient is to continue on topical Timoptic therapy 1 drop each eye twice daily.  #4.  Patient to have cataract surgery evaluation of the left eye any time that her visual acuity is affected or declines.  Ophthalmic Meds Ordered this visit:  No orders of the defined types were placed in this encounter.      Return in about 1 year (around 07/08/2021) for DILATE OU, OCT.  Patient Instructions  Patient to report any new onset visual acuity difficulties or declines.    Explained the diagnoses, plan, and follow up with the patient and they expressed understanding.  Patient expressed understanding of the importance of proper follow up care.   Clent Demark Ankush Gintz M.D. Diseases & Surgery of the Retina and Vitreous Retina & Diabetic [place] 07/08/20     Abbreviations: M myopia (nearsighted); A astigmatism; H hyperopia (farsighted); P presbyopia; Mrx spectacle prescription;  CTL contact lenses; OD right eye; OS left eye; OU both eyes  XT exotropia; ET esotropia; PEK punctate epithelial keratitis; PEE punctate epithelial erosions; DES dry eye syndrome; MGD meibomian gland dysfunction; ATs artificial tears; PFAT's preservative free artificial tears; [place] nuclear sclerotic cataract; PSC posterior subcapsular cataract; ERM epi-retinal membrane; PVD posterior vitreous detachment; RD retinal detachment; DM diabetes mellitus; DR diabetic retinopathy; NPDR non-proliferative diabetic retinopathy; PDR proliferative diabetic retinopathy; CSME clinically significant macular edema; DME diabetic  macular edema; dbh dot blot hemorrhages; CWS cotton wool spot; POAG primary open angle glaucoma; C/D cup-to-disc ratio; HVF humphrey visual field; GVF goldmann visual field; OCT optical coherence tomography; IOP intraocular pressure; BRVO Branch retinal vein occlusion; CRVO central retinal vein occlusion; CRAO central retinal artery occlusion; BRAO branch retinal artery occlusion; RT retinal tear; SB scleral buckle; PPV pars plana vitrectomy; VH Vitreous hemorrhage; PRP panretinal laser photocoagulation; IVK intravitreal kenalog; VMT vitreomacular traction; MH Macular hole;  NVD neovascularization of the disc; NVE neovascularization elsewhere; AREDS age related eye disease study; ARMD age related macular degeneration; POAG primary open angle glaucoma; EBMD epithelial/anterior basement membrane dystrophy; ACIOL anterior chamber intraocular lens; IOL intraocular lens; PCIOL posterior chamber intraocular lens; Phaco/IOL phacoemulsification with intraocular lens placement; [place] photorefractive keratectomy; LASIK laser assisted in situ keratomileusis; HTN hypertension; DM diabetes mellitus; COPD chronic obstructive pulmonary disease

## 2020-07-08 NOTE — Patient Instructions (Signed)
Patient to report any new onset visual acuity difficulties or declines.

## 2020-12-04 ENCOUNTER — Other Ambulatory Visit (INDEPENDENT_AMBULATORY_CARE_PROVIDER_SITE_OTHER): Payer: Self-pay | Admitting: Ophthalmology

## 2020-12-25 ENCOUNTER — Other Ambulatory Visit (INDEPENDENT_AMBULATORY_CARE_PROVIDER_SITE_OTHER): Payer: Self-pay | Admitting: Ophthalmology

## 2021-07-11 ENCOUNTER — Encounter (INDEPENDENT_AMBULATORY_CARE_PROVIDER_SITE_OTHER): Payer: Self-pay | Admitting: Ophthalmology

## 2021-07-11 ENCOUNTER — Other Ambulatory Visit: Payer: Self-pay

## 2021-07-11 ENCOUNTER — Ambulatory Visit (INDEPENDENT_AMBULATORY_CARE_PROVIDER_SITE_OTHER): Payer: Medicare HMO | Admitting: Ophthalmology

## 2021-07-11 DIAGNOSIS — Z9889 Other specified postprocedural states: Secondary | ICD-10-CM | POA: Diagnosis not present

## 2021-07-11 DIAGNOSIS — H2512 Age-related nuclear cataract, left eye: Secondary | ICD-10-CM | POA: Diagnosis not present

## 2021-07-11 DIAGNOSIS — E119 Type 2 diabetes mellitus without complications: Secondary | ICD-10-CM

## 2021-07-11 NOTE — Assessment & Plan Note (Signed)
History of macular hole and successful repair July 2011, Dr. Zadie Rhine

## 2021-07-11 NOTE — Progress Notes (Signed)
07/11/2021     CHIEF COMPLAINT Patient presents for  Chief Complaint  Patient presents with   Retina Follow Up      HISTORY OF PRESENT ILLNESS: Nichole [name] is a 75 y.o. female who presents to the clinic today for:   HPI     Retina Follow Up   Patient presents with  Diabetic Retinopathy.  In both eyes.  This started 1 year ago.  Duration of 1 year.        Comments   1 year OU with OCT  Pt denies any visual changes since previous visit. Pt denies any flashes or floaters. Pt denies any eye pain.   Type 1 Diabetic. Diagnosed ~ 30 years. A1c: 6.2 or 6.5  Eye Meds: Timolol QAM OU Latanoprost QHS OU      Last edited by Reather Littler, COA on 07/11/2021  9:20 AM.      Referring physician: Wenda Low, MD [address] [place],  [place] [address]  HISTORICAL INFORMATION:   Selected notes from the MEDICAL RECORD NUMBER    Lab Results  Component Value Date   HGBA1C 8.6 (H) 07/08/2013     CURRENT MEDICATIONS: Current Outpatient Medications (Ophthalmic Drugs)  Medication Sig   latanoprost (XALATAN) 0.005 % ophthalmic solution INSTILL ONE DROP INTO EACH EYE ONE TIME DAILY AT NIGHT AS DIRECTED   Tetrahydrozoline-Zn Sulfate (EYE DROPS A/C OP) Apply to eye. One drop in each eye daily   timolol (TIMOPTIC) 0.5 % ophthalmic solution INSTILL ONE DROP IN [facility] EYE EVERY MORNING   No current facility-administered medications for this visit. (Ophthalmic Drugs)   Current Outpatient Medications (Other)  Medication Sig   aspirin 81 MG tablet Take 81 mg by mouth daily.   atorvastatin (LIPITOR) 20 MG tablet Take 20 mg by mouth daily.   BAYER CONTOUR NEXT TEST test strip    BAYER MICROLET LANCETS lancets    Cholecalciferol (VITAMIN D PO) Take 500 Units by mouth daily.   FLUVIRIN INJ injection    glucagon 1 MG injection Inject 1 mg into the vein once as needed.   insulin aspart (NOVOLOG) 100 UNIT/ML SOCT cartridge Inject into the skin 3 (three) times daily  with meals. Max 35 units   levothyroxine (SYNTHROID, LEVOTHROID) 75 MCG tablet Take 75 mcg by mouth daily before breakfast.   traMADol (ULTRAM) 50 MG tablet Take 1 tablet (50 mg total) by mouth every 6 (six) hours as needed.   No current facility-administered medications for this visit. (Other)      REVIEW OF SYSTEMS:    ALLERGIES No Known Allergies  PAST MEDICAL HISTORY Past Medical History:  Diagnosis Date   Diabetes mellitus without complication ([place])    Hyperlipidemia    Thyroid disease    Past Surgical History:  Procedure Laterality Date   BREAST BIOPSY Right 2015   benign    FAMILY HISTORY History reviewed. No pertinent family history.  SOCIAL HISTORY Social History   Tobacco Use   Smoking status: Never   Smokeless tobacco: Never  Vaping Use   Vaping Use: Never used  Substance Use Topics   Alcohol use: No   Drug use: No         OPHTHALMIC EXAM:  Base Eye Exam     Visual Acuity (ETDRS)       Right Left   Dist [place] 20/40 -2 20/50 +2   Dist ph [place] 20/40 +2 20/25 +1  Tonometry (Tonopen, 9:23 AM)       Right Left   Pressure 18 15         Pupils       Pupils Dark Light Shape React APD   Right PERRL 4 3 Round Brisk None   Left PERRL 4 3 Round Brisk None         Visual Fields (Counting fingers)       Left Right    Full Full         Extraocular Movement       Right Left    Full, Ortho Full, Ortho         Neuro/Psych     Oriented x3: Yes   Mood/Affect: Normal         Dilation     Both eyes: 1.0% Mydriacyl, 2.5% Phenylephrine @ 9:23 AM           Slit Lamp and Fundus Exam     External Exam       Right Left   External Normal Normal         Slit Lamp Exam       Right Left   Lids/Lashes Normal Normal   Conjunctiva/Sclera White and quiet White and quiet   Cornea Clear Clear   Anterior Chamber Deep and quiet Deep and quiet   Iris Round and reactive Round and reactive   Anterior Vitreous Normal  Normal         Fundus Exam       Right Left   Posterior Vitreous Vitrectomized, clear Posterior vitreous detachment   Disc Normal Normal   C/D Ratio 0.7 0.7   Macula Macular hole, closed, negative Watzke sign Normal   Vessels Normal, , no DR Normal, , no DR   Periphery Normal Normal            IMAGING AND PROCEDURES  Imaging and Procedures for 07/11/21  OCT, Retina - OU - Both Eyes       Right Eye Quality was good. Scan locations included subfoveal. Central Foveal Thickness: 289. Progression has been stable. Findings include abnormal foveal contour.   Left Eye Quality was good. Scan locations included subfoveal. Central Foveal Thickness: 255. Progression has been stable. Findings include normal observations, normal foveal contour.   Notes History of macular hole right eye, status post successful closure, some variable appearance to the center of the fovea yet with excellent vision.  Clinically this appears to be a pseudohole.             ASSESSMENT/PLAN:  Nuclear sclerotic cataract of left eye OS with progression of [place] accounts for acuity will need evaluation  Referred to [facility]  Diabetes mellitus without complication ([place]) No detectable diabetic retinopathy  History of vitrectomy History of macular hole and successful repair July 2011, Dr. Zadie Rhine     ICD-10-CM   1. Diabetes mellitus without complication (HCC)  [id number] OCT, Retina - OU - Both Eyes    2. Nuclear sclerotic cataract of left eye  H25.12     3. History of vitrectomy  Z98.890       1.  OD, stable acuity, with history of macular hole and successful repair 2011  2.  OS with progressive NSC changes, does not have general eye care physician, will schedule evaluation with [facility]  3.  Ophthalmic Meds Ordered this visit:  No orders of the defined types were placed in this encounter.      Return  in about 1 year (around 07/11/2022) for DILATE OU, OCT, COLOR FP.  There are  no Patient Instructions on file for this visit.   Explained the diagnoses, plan, and follow up with the patient and they expressed understanding.  Patient expressed understanding of the importance of proper follow up care.   Clent Demark Leva Baine M.D. Diseases & Surgery of the Retina and Vitreous Retina & Diabetic [place] 07/11/21     Abbreviations: M myopia (nearsighted); A astigmatism; H hyperopia (farsighted); P presbyopia; Mrx spectacle prescription;  CTL contact lenses; OD right eye; OS left eye; OU both eyes  XT exotropia; ET esotropia; PEK punctate epithelial keratitis; PEE punctate epithelial erosions; DES dry eye syndrome; MGD meibomian gland dysfunction; ATs artificial tears; PFAT's preservative free artificial tears; [place] nuclear sclerotic cataract; PSC posterior subcapsular cataract; ERM epi-retinal membrane; PVD posterior vitreous detachment; RD retinal detachment; DM diabetes mellitus; DR diabetic retinopathy; NPDR non-proliferative diabetic retinopathy; PDR proliferative diabetic retinopathy; CSME clinically significant macular edema; DME diabetic macular edema; dbh dot blot hemorrhages; CWS cotton wool spot; POAG primary open angle glaucoma; C/D cup-to-disc ratio; HVF humphrey visual field; GVF goldmann visual field; OCT optical coherence tomography; IOP intraocular pressure; BRVO Branch retinal vein occlusion; CRVO central retinal vein occlusion; CRAO central retinal artery occlusion; BRAO branch retinal artery occlusion; RT retinal tear; SB scleral buckle; PPV pars plana vitrectomy; VH Vitreous hemorrhage; PRP panretinal laser photocoagulation; IVK intravitreal kenalog; VMT vitreomacular traction; MH Macular hole;  NVD neovascularization of the disc; NVE neovascularization elsewhere; AREDS age related eye disease study; ARMD age related macular degeneration; POAG primary open angle glaucoma; EBMD epithelial/anterior basement membrane dystrophy; ACIOL anterior chamber intraocular lens; IOL  intraocular lens; PCIOL posterior chamber intraocular lens; Phaco/IOL phacoemulsification with intraocular lens placement; [place] photorefractive keratectomy; LASIK laser assisted in situ keratomileusis; HTN hypertension; DM diabetes mellitus; COPD chronic obstructive pulmonary disease

## 2021-07-11 NOTE — Assessment & Plan Note (Signed)
OS with progression of [place] accounts for acuity will need evaluation  Referred to [facility]

## 2021-07-11 NOTE — Assessment & Plan Note (Signed)
No detectable diabetic retinopathy 

## 2021-08-02 ENCOUNTER — Encounter (INDEPENDENT_AMBULATORY_CARE_PROVIDER_SITE_OTHER): Payer: Self-pay

## 2021-12-14 ENCOUNTER — Other Ambulatory Visit (INDEPENDENT_AMBULATORY_CARE_PROVIDER_SITE_OTHER): Payer: Self-pay | Admitting: Ophthalmology

## 2021-12-20 ENCOUNTER — Other Ambulatory Visit (INDEPENDENT_AMBULATORY_CARE_PROVIDER_SITE_OTHER): Payer: Self-pay | Admitting: Ophthalmology

## 2022-07-11 ENCOUNTER — Ambulatory Visit (INDEPENDENT_AMBULATORY_CARE_PROVIDER_SITE_OTHER): Payer: Medicare HMO | Admitting: Ophthalmology

## 2022-07-11 DIAGNOSIS — Z9889 Other specified postprocedural states: Secondary | ICD-10-CM | POA: Diagnosis not present

## 2022-07-11 DIAGNOSIS — H2512 Age-related nuclear cataract, left eye: Secondary | ICD-10-CM

## 2022-07-11 DIAGNOSIS — H40013 Open angle with borderline findings, low risk, bilateral: Secondary | ICD-10-CM

## 2022-07-11 DIAGNOSIS — E119 Type 2 diabetes mellitus without complications: Secondary | ICD-10-CM | POA: Diagnosis not present

## 2022-07-11 NOTE — Assessment & Plan Note (Signed)
Stable OD °

## 2022-07-11 NOTE — Assessment & Plan Note (Signed)
Follow-up with general ophthalmologist as scheduled

## 2022-07-11 NOTE — Assessment & Plan Note (Signed)
No detectable diabetic retinopathy 

## 2022-07-11 NOTE — Assessment & Plan Note (Signed)
Stable

## 2022-07-11 NOTE — Progress Notes (Signed)
07/11/2022     CHIEF COMPLAINT Patient presents for  Chief Complaint  Patient presents with   Retina Evaluation      HISTORY OF PRESENT ILLNESS: Nichole [name] is a 76 y.o. female who presents to the clinic today for:   HPI   Diabetes mellitus without complication 1 year fu ou fp  Pt states her vision has been stable Pt denies any new floaters or FOL  Last edited by Morene Rankins, CMA on 07/11/2022  8:58 AM.      Referring physician: Wenda Low, MD [address] [place],  [place] [address]  HISTORICAL INFORMATION:   Selected notes from the MEDICAL RECORD NUMBER    Lab Results  Component Value Date   HGBA1C 8.6 (H) 07/08/2013     CURRENT MEDICATIONS: Current Outpatient Medications (Ophthalmic Drugs)  Medication Sig   latanoprost (XALATAN) 0.005 % ophthalmic solution INSTILL ONE DROP INTO EACH EYE ONE TIME DAILY AT NIGHT AS DIRECTED   Tetrahydrozoline-Zn Sulfate (EYE DROPS A/C OP) Apply to eye. One drop in each eye daily   timolol (TIMOPTIC) 0.5 % ophthalmic solution INSTILL ONE DROP IN [facility] EYE EVERY MORNING   No current facility-administered medications for this visit. (Ophthalmic Drugs)   Current Outpatient Medications (Other)  Medication Sig   aspirin 81 MG tablet Take 81 mg by mouth daily.   atorvastatin (LIPITOR) 20 MG tablet Take 20 mg by mouth daily.   BAYER CONTOUR NEXT TEST test strip    BAYER MICROLET LANCETS lancets    Cholecalciferol (VITAMIN D PO) Take 500 Units by mouth daily.   FLUVIRIN INJ injection    glucagon 1 MG injection Inject 1 mg into the vein once as needed.   insulin aspart (NOVOLOG) 100 UNIT/ML SOCT cartridge Inject into the skin 3 (three) times daily with meals. Max 35 units   levothyroxine (SYNTHROID, LEVOTHROID) 75 MCG tablet Take 75 mcg by mouth daily before breakfast.   traMADol (ULTRAM) 50 MG tablet Take 1 tablet (50 mg total) by mouth every 6 (six) hours as needed.   No current facility-administered  medications for this visit. (Other)      REVIEW OF SYSTEMS: ROS   Negative for: Constitutional, Gastrointestinal, Neurological, Skin, Genitourinary, Musculoskeletal, HENT, Endocrine, Cardiovascular, Eyes, Respiratory, Psychiatric, Allergic/Imm, Heme/Lymph Last edited by Morene Rankins, CMA on 07/11/2022  8:58 AM.       ALLERGIES No Known Allergies  PAST MEDICAL HISTORY Past Medical History:  Diagnosis Date   Diabetes mellitus without complication (Nichole [name])    Hyperlipidemia    Thyroid disease    Past Surgical History:  Procedure Laterality Date   BREAST BIOPSY Right 2015   benign    FAMILY HISTORY No family history on file.  SOCIAL HISTORY Social History   Tobacco Use   Smoking status: Never   Smokeless tobacco: Never  Vaping Use   Vaping Use: Never used  Substance Use Topics   Alcohol use: No   Drug use: No         OPHTHALMIC EXAM:  Base Eye Exam     Visual Acuity (ETDRS)       Right Left   Dist [place] 20/40 +2 20/25 +1         Tonometry (Tonopen, 9:04 AM)       Right Left   Pressure 10 7         Visual Fields       Left Right    Full Full  Extraocular Movement       Right Left    Ortho Ortho    -- -- --  --  --  -- -- --   -- -- --  --  --  -- -- --           Neuro/Psych     Oriented x3: Yes   Mood/Affect: Normal         Dilation     Both eyes: 1.0% Mydriacyl, 2.5% Phenylephrine @ 8:59 AM           Slit Lamp and Fundus Exam     External Exam       Right Left   External Normal Normal         Slit Lamp Exam       Right Left   Lids/Lashes Normal Normal   Conjunctiva/Sclera White and quiet White and quiet   Cornea Clear Clear   Anterior Chamber Deep and quiet Deep and quiet   Iris Round and reactive Round and reactive   Lens Centered posterior chamber intraocular lens 2+ Nuclear sclerosis   Anterior Vitreous Normal Normal         Fundus Exam       Right Left   Posterior Vitreous  Vitrectomized, clear Posterior vitreous detachment   Disc Normal Normal   C/D Ratio 0.7 0.7   Macula Macular hole, closed, negative Watzke sign Normal   Vessels Normal, , no DR Normal, , no DR   Periphery Normal Normal            IMAGING AND PROCEDURES  Imaging and Procedures for 07/11/22  OCT, Retina - OU - Both Eyes       Right Eye Quality was good. Scan locations included subfoveal. Central Foveal Thickness: 282. Progression has been stable. Findings include abnormal foveal contour.   Left Eye Quality was good. Scan locations included subfoveal. Central Foveal Thickness: 258. Progression has been stable. Findings include normal observations, normal foveal contour.   Notes History of macular hole right eye, status post successful closure, some variable appearance to the center of the fovea yet with excellent vision.  Clinically this appears to be a pseudohole.     Color Fundus Photography Optos - OU - Both Eyes       Right Eye Progression has been stable. Disc findings include increased cup to disc ratio. Macula : normal observations. Vessels : normal observations. Periphery : normal observations.   Left Eye Progression has been stable. Disc findings include increased cup to disc ratio. Macula : normal observations. Vessels : normal observations. Periphery : normal observations.   Notes No detectable DR             ASSESSMENT/PLAN:  Nuclear sclerotic cataract of left eye Follow-up with general ophthalmologist as scheduled  Diabetes mellitus without complication ([place]) No detectable diabetic retinopathy  Open angle with borderline findings and low glaucoma risk in both eyes Stable  History of vitrectomy Stable OD     ICD-10-CM   1. Diabetes mellitus without complication (HCC)  F62.1 OCT, Retina - OU - Both Eyes    Color Fundus Photography Optos - OU - Both Eyes    2. Nuclear sclerotic cataract of left eye  H25.12     3. Open angle with borderline  findings and low glaucoma risk in both eyes  H40.013     4. History of vitrectomy  Z98.890       1.  OAG, continue medications as scheduled  2.  No Detectable diabetic retinopathy  3.  [place] changes, follow-up with general ophthalmology as scheduled  4.  History of macular hole and repair vitrectomy OD, looks great  Ophthalmic Meds Ordered this visit:  No orders of the defined types were placed in this encounter.      Return in about 1 year (around 07/12/2023) for DILATE OU.  There are no Patient Instructions on file for this visit.   Explained the diagnoses, plan, and follow up with the patient and they expressed understanding.  Patient expressed understanding of the importance of proper follow up care.   Clent Demark Tyrea Froberg M.D. Diseases & Surgery of the Retina and Vitreous [facility] [place] 07/11/22     Abbreviations: M myopia (nearsighted); A astigmatism; H hyperopia (farsighted); P presbyopia; Mrx spectacle prescription;  CTL contact lenses; OD right eye; OS left eye; OU both eyes  XT exotropia; ET esotropia; PEK punctate epithelial keratitis; PEE punctate epithelial erosions; DES dry eye syndrome; MGD meibomian gland dysfunction; ATs artificial tears; PFAT's preservative free artificial tears; [place] nuclear sclerotic cataract; PSC posterior subcapsular cataract; ERM epi-retinal membrane; PVD posterior vitreous detachment; RD retinal detachment; DM diabetes mellitus; DR diabetic retinopathy; NPDR non-proliferative diabetic retinopathy; PDR proliferative diabetic retinopathy; CSME clinically significant macular edema; DME diabetic macular edema; dbh dot blot hemorrhages; CWS cotton wool spot; POAG primary open angle glaucoma; C/D cup-to-disc ratio; HVF humphrey visual field; GVF goldmann visual field; OCT optical coherence tomography; IOP intraocular pressure; BRVO Branch retinal vein occlusion; CRVO central retinal vein occlusion; CRAO central retinal artery occlusion; BRAO  branch retinal artery occlusion; RT retinal tear; SB scleral buckle; PPV pars plana vitrectomy; VH Vitreous hemorrhage; PRP panretinal laser photocoagulation; IVK intravitreal kenalog; VMT vitreomacular traction; MH Macular hole;  NVD neovascularization of the disc; NVE neovascularization elsewhere; AREDS age related eye disease study; ARMD age related macular degeneration; POAG primary open angle glaucoma; EBMD epithelial/anterior basement membrane dystrophy; ACIOL anterior chamber intraocular lens; IOL intraocular lens; PCIOL posterior chamber intraocular lens; Phaco/IOL phacoemulsification with intraocular lens placement; [place] photorefractive keratectomy; LASIK laser assisted in situ keratomileusis; HTN hypertension; DM diabetes mellitus; COPD chronic obstructive pulmonary disease

## 2023-07-12 ENCOUNTER — Encounter (INDEPENDENT_AMBULATORY_CARE_PROVIDER_SITE_OTHER): Payer: Medicare HMO | Admitting: Ophthalmology
